# Patient Record
Sex: Female | Born: 1975 | Race: White | Hispanic: No | Marital: Single | Smoking: Former smoker
Health system: Southern US, Community
[De-identification: ages and names within clinical notes are randomized; demographics above are authoritative.]

## PROBLEM LIST (undated history)

## (undated) DIAGNOSIS — M5136 Other intervertebral disc degeneration, lumbar region: Secondary | ICD-10-CM

## (undated) DIAGNOSIS — M779 Enthesopathy, unspecified: Secondary | ICD-10-CM

## (undated) DIAGNOSIS — F419 Anxiety disorder, unspecified: Secondary | ICD-10-CM

## (undated) DIAGNOSIS — M51369 Other intervertebral disc degeneration, lumbar region without mention of lumbar back pain or lower extremity pain: Secondary | ICD-10-CM

## (undated) DIAGNOSIS — M1990 Unspecified osteoarthritis, unspecified site: Secondary | ICD-10-CM

## (undated) DIAGNOSIS — IMO0002 Reserved for concepts with insufficient information to code with codable children: Secondary | ICD-10-CM

## (undated) DIAGNOSIS — F329 Major depressive disorder, single episode, unspecified: Secondary | ICD-10-CM

## (undated) DIAGNOSIS — F32A Depression, unspecified: Secondary | ICD-10-CM

## (undated) DIAGNOSIS — D259 Leiomyoma of uterus, unspecified: Secondary | ICD-10-CM

## (undated) DIAGNOSIS — M419 Scoliosis, unspecified: Secondary | ICD-10-CM

## (undated) DIAGNOSIS — M329 Systemic lupus erythematosus, unspecified: Secondary | ICD-10-CM

## (undated) HISTORY — PX: HERNIA REPAIR: SHX51

## (undated) HISTORY — PX: INSERTION OF MESH: SHX5868

## (undated) HISTORY — PX: LUMBAR FUSION: SHX111

## (undated) HISTORY — PX: ABDOMINAL HYSTERECTOMY: SHX81

---

## 2008-02-08 ENCOUNTER — Emergency Department (HOSPITAL_COMMUNITY): Admission: EM | Admit: 2008-02-08 | Discharge: 2008-02-08 | Payer: Self-pay | Admitting: Emergency Medicine

## 2008-04-27 ENCOUNTER — Emergency Department (HOSPITAL_COMMUNITY): Admission: EM | Admit: 2008-04-27 | Discharge: 2008-04-27 | Payer: Self-pay | Admitting: Emergency Medicine

## 2008-08-29 ENCOUNTER — Emergency Department (HOSPITAL_COMMUNITY): Admission: EM | Admit: 2008-08-29 | Discharge: 2008-08-29 | Payer: Self-pay | Admitting: Emergency Medicine

## 2009-06-29 ENCOUNTER — Emergency Department (HOSPITAL_COMMUNITY): Admission: EM | Admit: 2009-06-29 | Discharge: 2009-06-29 | Payer: Self-pay | Admitting: Emergency Medicine

## 2009-10-15 ENCOUNTER — Emergency Department (HOSPITAL_COMMUNITY): Admission: EM | Admit: 2009-10-15 | Discharge: 2009-10-15 | Payer: Self-pay | Admitting: Emergency Medicine

## 2009-11-29 IMAGING — CR DG CHEST 2V
2 series · 2 of 2 positions shown · non-contrast
Comparison: None

CLINICAL DATA: Swelling

CHEST - 2 VIEW

[view not recorded (1 of 2)]
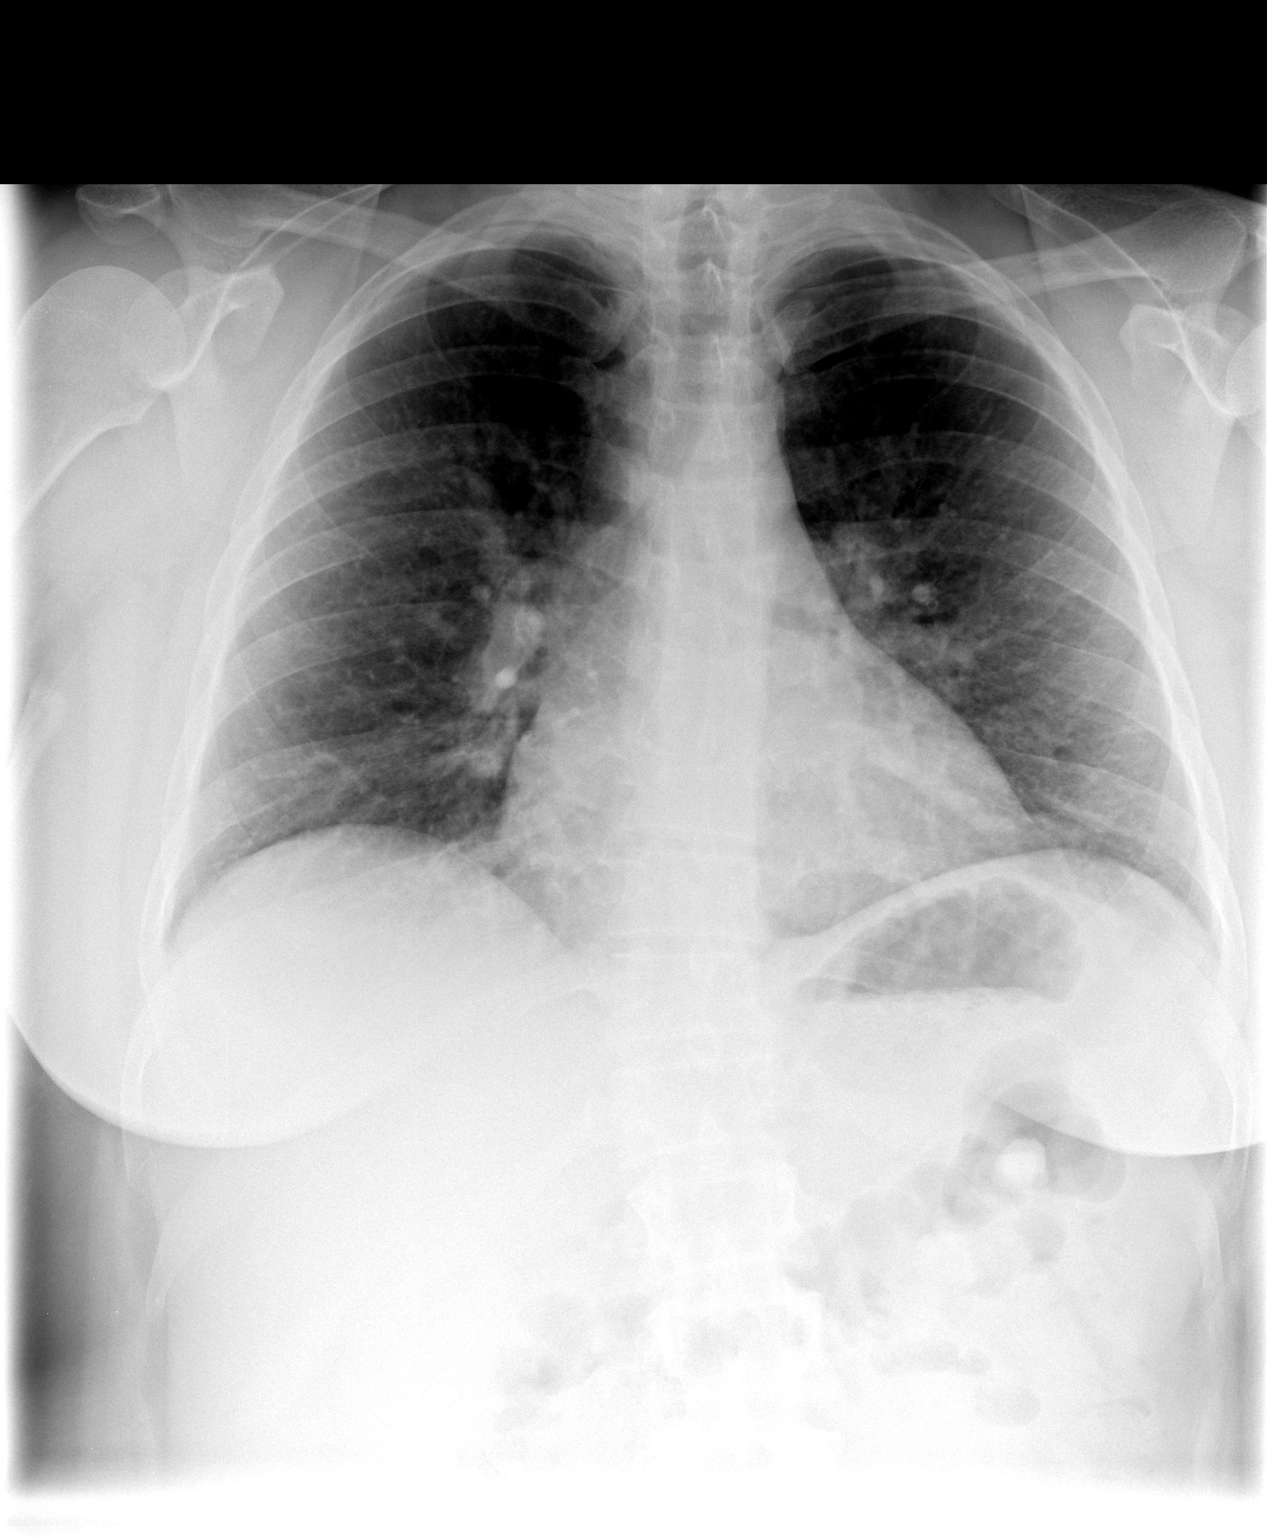

[view not recorded (2 of 2)]
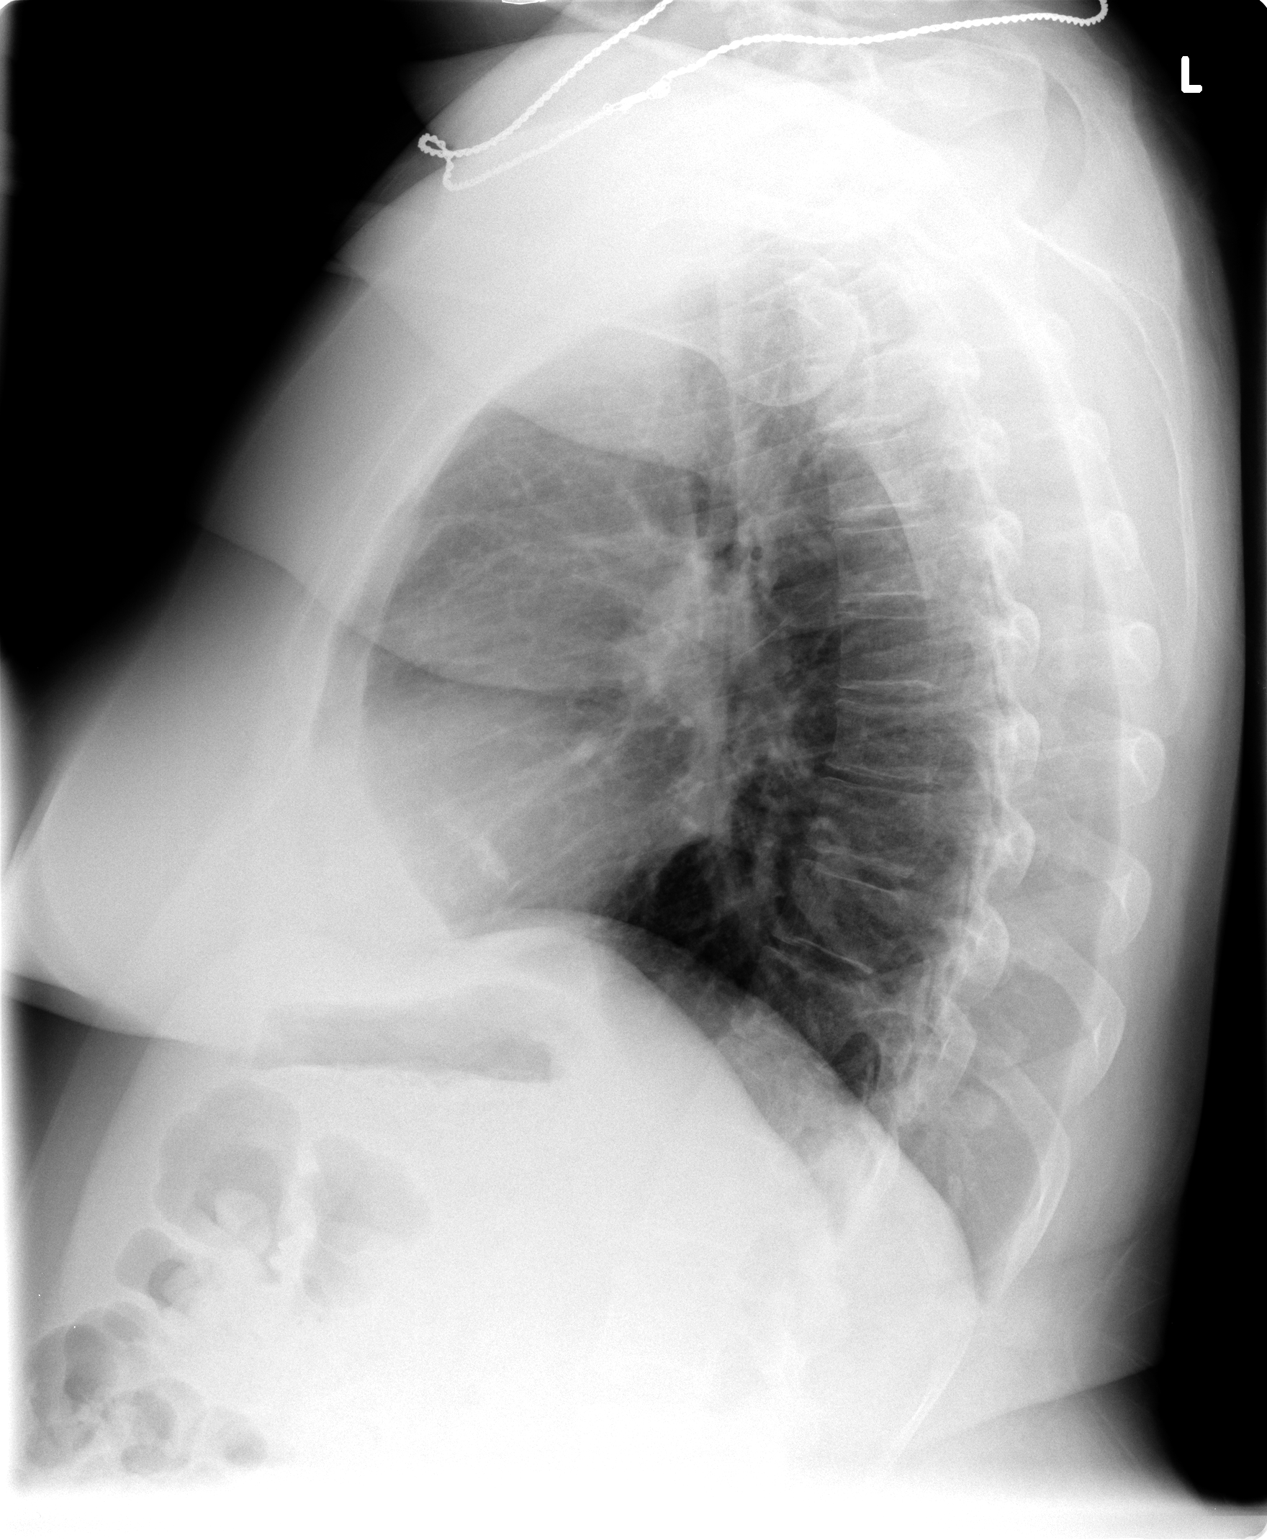

[2 of 2 positions shown; findings below may reference images not displayed]

FINDINGS: There is cardiac enlargement and pulmonary venous
congestion

No pleural effusions or pulmonary interstitial edema

No airspace densities noted.

The visualized osseous structures are unremarkable.
IMPRESSION: 1.  Cardiac enlargement with pulmonary venous congestion,

## 2011-02-23 LAB — SEDIMENTATION RATE: Sed Rate: 12

## 2011-02-23 LAB — D-DIMER, QUANTITATIVE: D-Dimer, Quant: 0.56 — ABNORMAL HIGH

## 2011-02-23 LAB — URINALYSIS, ROUTINE W REFLEX MICROSCOPIC
Bilirubin Urine: NEGATIVE
Protein, ur: NEGATIVE
Specific Gravity, Urine: >1.03 — ABNORMAL HIGH
Urobilinogen, UA: 0.2

## 2011-02-23 LAB — CBC
Hemoglobin: 11.7 — ABNORMAL LOW
RBC: 3.93

## 2011-02-23 LAB — DIFFERENTIAL
Basophils Relative: 0
Eosinophils Absolute: 0.2
Neutrophils Relative %: 58

## 2011-02-23 LAB — COMPREHENSIVE METABOLIC PANEL
ALT: 24
Alkaline Phosphatase: 75
CO2: 26
GFR calc non Af Amer: >60
Glucose, Bld: 101 — ABNORMAL HIGH
Potassium: 3.3 — ABNORMAL LOW
Sodium: 136

## 2011-02-23 LAB — URINE MICROSCOPIC-ADD ON

## 2011-02-23 LAB — TSH: TSH: 1.466

## 2014-04-11 ENCOUNTER — Encounter (HOSPITAL_COMMUNITY): Payer: Self-pay | Admitting: *Deleted

## 2014-04-11 ENCOUNTER — Emergency Department (HOSPITAL_COMMUNITY)
Admission: EM | Admit: 2014-04-11 | Discharge: 2014-04-11 | Disposition: A | Discharge status: Home / Self Care | Payer: Medicaid Other | Attending: Emergency Medicine | Admitting: Emergency Medicine

## 2014-04-11 DIAGNOSIS — Y9389 Activity, other specified: Secondary | ICD-10-CM | POA: Diagnosis not present

## 2014-04-11 DIAGNOSIS — W1849XA Other slipping, tripping and stumbling without falling, initial encounter: Secondary | ICD-10-CM | POA: Insufficient documentation

## 2014-04-11 DIAGNOSIS — R109 Unspecified abdominal pain: Secondary | ICD-10-CM | POA: Insufficient documentation

## 2014-04-11 DIAGNOSIS — Y929 Unspecified place or not applicable: Secondary | ICD-10-CM | POA: Insufficient documentation

## 2014-04-11 DIAGNOSIS — Z8739 Personal history of other diseases of the musculoskeletal system and connective tissue: Secondary | ICD-10-CM | POA: Diagnosis not present

## 2014-04-11 DIAGNOSIS — Y998 Other external cause status: Secondary | ICD-10-CM | POA: Insufficient documentation

## 2014-04-11 DIAGNOSIS — S81812A Laceration without foreign body, left lower leg, initial encounter: Secondary | ICD-10-CM | POA: Insufficient documentation

## 2014-04-11 DIAGNOSIS — Z872 Personal history of diseases of the skin and subcutaneous tissue: Secondary | ICD-10-CM | POA: Diagnosis not present

## 2014-04-11 HISTORY — DX: Reserved for concepts with insufficient information to code with codable children: IMO0002

## 2014-04-11 HISTORY — DX: Systemic lupus erythematosus, unspecified: M32.9

## 2014-04-11 HISTORY — DX: Unspecified osteoarthritis, unspecified site: M19.90

## 2014-04-11 MED ORDER — HYDROCODONE-ACETAMINOPHEN 5-325 MG PO TABS: 1.0000 | ORAL_TABLET | ORAL | Status: DC | PRN

## 2014-04-11 MED ORDER — LIDOCAINE-EPINEPHRINE (PF) 2 %-1:200000 IJ SOLN
10.0000 mL | Freq: Once | INTRAMUSCULAR | Status: AC
  Administered 2014-04-11: 10 mL
  Filled 2014-04-11: qty 20

## 2014-04-11 MED ORDER — POVIDONE-IODINE 10 % EX SOLN
CUTANEOUS | Status: AC
  Administered 2014-04-11: 17:00:00
  Filled 2014-04-11: qty 118

## 2014-04-11 MED ORDER — DOXYCYCLINE HYCLATE 100 MG PO CAPS: 100.0000 mg | ORAL_CAPSULE | Freq: Two times a day (BID) | ORAL | Status: DC

## 2014-04-11 MED ORDER — HYDROCODONE-ACETAMINOPHEN 5-325 MG PO TABS
1.0000 | ORAL_TABLET | Freq: Once | ORAL | Status: AC
  Administered 2014-04-11: 1 via ORAL
  Filled 2014-04-11: qty 1

## 2014-04-11 NOTE — ED Notes (Signed)
Laceration tray and stapler set up at bedside.  Lidocaine at bedside for PA to administer.

## 2014-04-11 NOTE — ED Notes (Signed)
Pt states tripped over a woodpile today at 0430, pt has lac to lower lt calf, bleeding controlled at triage.

## 2014-04-11 NOTE — Discharge Instructions (Signed)
Please cleanse the wound with soap and water. Please apply clean dressing with Neosporin daily. Please have your staples removed in 10 days. Please see your primary physician or return to the emergency department if any red streaking, or Stitches, Staples, or Skin Adhesive Strips  Stitches (sutures), staples, and skin adhesive strips hold the skin together as it heals. They will usually be in place for 7 days or less. HOME CARE  Wash your hands with soap and water before and after you touch your wound.  Only take medicine as told by your doctor.  Cover your wound only if your doctor told you to. Otherwise, leave it open to air.  Do not get your stitches wet or dirty. If they get dirty, dab them gently with a clean washcloth. Wet the washcloth with soapy water. Do not rub. Pat them dry gently.  Do not put medicine or medicated cream on your stitches unless your doctor told you to.  Do not take out your own stitches or staples. Skin adhesive strips will fall off by themselves.  Do not pick at the wound. Picking can cause an infection.  Do not miss your follow-up appointment.  If you have problems or questions, call your doctor. GET HELP RIGHT AWAY IF:   You have a temperature by mouth above 102 F (38.9 C), not controlled by medicine.  You have chills.  You have redness or pain around your stitches.  There is puffiness (swelling) around your stitches.  You notice fluid (drainage) from your stitches.  There is a bad smell coming from your wound. MAKE SURE YOU:  Understand these instructions.  Will watch your condition.  Will get help if you are not doing well or get worse. Document Released: 03/08/2009 Document Revised: 08/03/2011 Document Reviewed: 03/08/2009 [facility] Patient Information 2015 [place], [place]. This information is not intended to replace advice given to you by your health care provider. Make sure you discuss any questions you have with your health care  provider.  signs of infection.

## 2014-04-11 NOTE — ED Provider Notes (Signed)
CSN: [id number]     Arrival date & time 04/11/14  1356 History   First MD Initiated Contact with Patient 04/11/14 1559     Chief Complaint  Patient presents with  . Extremity Laceration     (Consider location/radiation/quality/duration/timing/severity/associated sxs/prior Treatment) Patient is a 38 y.o. female presenting with skin laceration. The history is provided by the patient.  Laceration Location:  Leg Leg laceration location:  L lower leg Length (cm):  3.1 Depth:  Cutaneous Quality: jagged   Bleeding: controlled   Time since incident:  11 hours Injury mechanism: piece of wood. Pain details:    Quality:  Aching and throbbing   Severity:  Moderate   Timing:  Constant   Progression:  Worsening Foreign body present:  No foreign bodies Relieved by:  Nothing Ineffective treatments:  None tried Tetanus status:  Out of date   Past Medical History  Diagnosis Date  . Arthritis   . Lupus   . Ulcer of abdomen wall    History reviewed. No pertinent past surgical history. History reviewed. No pertinent family history. History  Substance Use Topics  . Smoking status: Never Smoker   . Smokeless tobacco: Not on file  . Alcohol Use: No   OB History    No data available     Review of Systems  Constitutional: Negative for activity change.       All ROS Neg except as noted in HPI  Eyes: Negative for photophobia and discharge.  Respiratory: Negative for cough, shortness of breath and wheezing.   Cardiovascular: Negative for chest pain and palpitations.  Gastrointestinal: Positive for abdominal pain. Negative for blood in stool.  Genitourinary: Negative for dysuria, frequency and hematuria.  Musculoskeletal: Positive for arthralgias. Negative for back pain and neck pain.  Skin: Negative.   Neurological: Negative for dizziness, seizures and speech difficulty.  Psychiatric/Behavioral: Negative for hallucinations and confusion.      Allergies  Nsaids  Home  Medications   Prior to Admission medications   Not on File   BP 113/83 mmHg  Pulse 80  Temp(Src) 98.7 F (37.1 C) (Oral)  Resp 18  Ht 5\' 3"  (1.6 m)  Wt 153 lb (69.4 kg)  BMI 27.11 kg/m2  SpO2 100%  LMP 04/06/2014 Physical Exam  Constitutional: She is oriented to person, place, and time. She appears well-developed and well-nourished.  Non-toxic appearance.  HENT:  Head: Normocephalic.  Right Ear: Tympanic membrane and external ear normal.  Left Ear: Tympanic membrane and external ear normal.  Eyes: EOM and lids are normal. Pupils are equal, round, and reactive to light.  Neck: Normal range of motion. Neck supple. Carotid bruit is not present.  Cardiovascular: Normal rate, regular rhythm, normal heart sounds, intact distal pulses and normal pulses.   Pulmonary/Chest: Breath sounds normal. No respiratory distress.  Abdominal: Soft. Bowel sounds are normal. There is no tenderness. There is no guarding.  Musculoskeletal: Normal range of motion.       Left lower leg: She exhibits tenderness.       Legs: Lymphadenopathy:       Head (right side): No submandibular adenopathy present.       Head (left side): No submandibular adenopathy present.    She has no cervical adenopathy.  Neurological: She is alert and oriented to person, place, and time. She has normal strength. No cranial nerve deficit or sensory deficit.  Skin: Skin is warm and dry.  Psychiatric: She has a normal mood and affect. Her speech is normal.  Nursing note and vitals reviewed.   ED Course  LACERATION REPAIR Date/Time: 04/11/2014 4:39 PM Performed by: Lenox Ahr Authorized by: Lenox Ahr Consent: Verbal consent obtained. Risks and benefits: risks, benefits and alternatives were discussed Consent given by: patient Patient understanding: patient states understanding of the procedure being performed Required items: required blood products, implants, devices, and special equipment available Patient  identity confirmed: arm band Time out: Immediately prior to procedure a "time out" was called to verify the correct patient, procedure, equipment, support staff and site/side marked as required. Body area: lower extremity Location details: left lower leg Laceration length: 3.1 cm Foreign bodies: no foreign bodies Tendon involvement: none Nerve involvement: none Vascular damage: no Anesthesia: local infiltration Local anesthetic: lidocaine 2% with epinephrine Patient sedated: no Preparation: Patient was prepped and draped in the usual sterile fashion. Irrigation solution: saline Amount of cleaning: standard Skin closure: staples Number of sutures: 5 Approximation difficulty: simple Dressing: gauze roll Patient tolerance: Patient tolerated the procedure well with no immediate complications   (including critical care time) Labs Review Labs Reviewed - No data to display  Imaging Review No results found.   EKG Interpretation None      MDM  Patient sustained the wound at approximately 4:30 in the morning. Discussed with the patient need to be on antibiotics because of the timeframe of delayed repair. The wound was repaired loosely. Patient is advised to change the dressing daily, she is placed on doxycycline and Norco. The patient is to see the primary physician or return to the emergency department if any signs of infection.    Final diagnoses:  Laceration of leg, left, initial encounter    *I have reviewed nursing notes, vital signs, and all appropriate lab and imaging results for this patient.[address] Pryor Montes, PA-C 04/11/14 2058  Orpah Greek, MD 04/11/14 [phone]

## 2014-05-26 ENCOUNTER — Emergency Department: Payer: Self-pay | Admitting: Internal Medicine

## 2014-06-24 ENCOUNTER — Emergency Department (HOSPITAL_COMMUNITY)
Admission: EM | Admit: 2014-06-24 | Discharge: 2014-06-24 | Disposition: A | Discharge status: Home / Self Care | Payer: Medicaid Other | Attending: Emergency Medicine | Admitting: Emergency Medicine

## 2014-06-24 ENCOUNTER — Encounter (HOSPITAL_COMMUNITY): Payer: Self-pay | Admitting: Emergency Medicine

## 2014-06-24 ENCOUNTER — Emergency Department (HOSPITAL_COMMUNITY): Payer: Medicaid Other

## 2014-06-24 DIAGNOSIS — Z872 Personal history of diseases of the skin and subcutaneous tissue: Secondary | ICD-10-CM | POA: Insufficient documentation

## 2014-06-24 DIAGNOSIS — M1990 Unspecified osteoarthritis, unspecified site: Secondary | ICD-10-CM | POA: Insufficient documentation

## 2014-06-24 DIAGNOSIS — Y9289 Other specified places as the place of occurrence of the external cause: Secondary | ICD-10-CM | POA: Insufficient documentation

## 2014-06-24 DIAGNOSIS — Y9389 Activity, other specified: Secondary | ICD-10-CM | POA: Diagnosis not present

## 2014-06-24 DIAGNOSIS — Y998 Other external cause status: Secondary | ICD-10-CM | POA: Insufficient documentation

## 2014-06-24 DIAGNOSIS — S300XXA Contusion of lower back and pelvis, initial encounter: Secondary | ICD-10-CM | POA: Diagnosis not present

## 2014-06-24 DIAGNOSIS — Z720 Tobacco use: Secondary | ICD-10-CM | POA: Insufficient documentation

## 2014-06-24 DIAGNOSIS — S161XXA Strain of muscle, fascia and tendon at neck level, initial encounter: Secondary | ICD-10-CM

## 2014-06-24 DIAGNOSIS — W01198A Fall on same level from slipping, tripping and stumbling with subsequent striking against other object, initial encounter: Secondary | ICD-10-CM | POA: Insufficient documentation

## 2014-06-24 DIAGNOSIS — Z8739 Personal history of other diseases of the musculoskeletal system and connective tissue: Secondary | ICD-10-CM | POA: Insufficient documentation

## 2014-06-24 DIAGNOSIS — S199XXA Unspecified injury of neck, initial encounter: Secondary | ICD-10-CM | POA: Diagnosis present

## 2014-06-24 MED ORDER — HYDROCODONE-ACETAMINOPHEN 5-325 MG PO TABS
1.0000 | ORAL_TABLET | Freq: Once | ORAL | Status: AC
  Administered 2014-06-24: 1 via ORAL
  Filled 2014-06-24: qty 1

## 2014-06-24 MED ORDER — HYDROCODONE-ACETAMINOPHEN 5-325 MG PO TABS: 1.0000 | ORAL_TABLET | ORAL | Status: DC | PRN

## 2014-06-24 MED ORDER — CYCLOBENZAPRINE HCL 10 MG PO TABS: 10.0000 mg | ORAL_TABLET | Freq: Two times a day (BID) | ORAL | Status: DC | PRN

## 2014-06-24 MED ORDER — CYCLOBENZAPRINE HCL 10 MG PO TABS
10.0000 mg | ORAL_TABLET | Freq: Once | ORAL | Status: AC
  Administered 2014-06-24: 10 mg via ORAL
  Filled 2014-06-24: qty 1

## 2014-06-24 NOTE — ED Provider Notes (Signed)
CSN: [id number]     Arrival date & time 06/24/14  1408 History  This chart was scribed for non-physician practitioner, Debroah Baller, FNP,working with Nat Christen, MD, by Marlowe Kays, ED Scribe. This patient was seen in room APFT22/APFT22 and the patient's care was started at 4:10 PM.  Chief Complaint  Patient presents with  . Back Pain  . Suture / Staple Removal   Patient is a 39 y.o. female presenting with back pain and suture removal. The history is provided by the patient, medical records and a parent. No language interpreter was used.  Back Pain Associated symptoms: no fever   Suture / Staple Removal    HPI Comments:  Megan Gregory is a 40 y.o. female who presents to the Emergency Department complaining of severe lower back pain that started secondary to slipping and falling on concrete flat on her back approximately six hours ago. She reports some associated neck pain that radiates down her right shoulder and arm. She reports some numbness and tingling in her right finger tips. She has taken Tylenol and Motrin (last dose PTA) with no significant relief of the pain. Movement or walking makes the pain worse. Denies alleviating factors. Denies LOC, visual changes, fever, chills, nausea, vomiting, bowel or bladder incontinence. PMH of arthritis, lupus, RA and abdominal ulcer. She has been scheduled to see a "spine" doctor due to her chronic low back pain and scoliosis. She has been told she has RA and other spinal problems that may require surgery. She is also in the process of getting scheduled to be seen in a pain clinic.  In addition she is here today for staple removal to the left lower leg that resulted from a previous fall.   Past Medical History  Diagnosis Date  . Arthritis   . Lupus   . Ulcer of abdomen wall    History reviewed. No pertinent past surgical history. Family History  Problem Relation Age of Onset  . Cancer Other   . Hypertension Other   . Stroke Other   .  Diabetes Other    History  Substance Use Topics  . Smoking status: Current Every Day Smoker -- 0.50 packs/day for 10 years    Types: Cigarettes  . Smokeless tobacco: Never Used  . Alcohol Use: No   OB History    Gravida Para Term Preterm AB TAB SAB Ectopic Multiple Living   3 2 2  1  1   2      Review of Systems  Constitutional: Negative for fever and chills.  Gastrointestinal: Negative for nausea and vomiting.  Genitourinary:       No bowel or bladder incontinence.  Musculoskeletal: Positive for back pain.  Skin: Positive for wound.  Neurological: Negative for syncope.  All other systems reviewed and are negative.   Allergies  Nsaids  Home Medications   Prior to Admission medications   Medication Sig Start Date End Date Taking? Authorizing Provider  cyclobenzaprine (FLEXERIL) 10 MG tablet Take 1 tablet (10 mg total) by mouth 2 (two) times daily as needed for muscle spasms. 06/24/14   Hope Bunnie Pion, NP  HYDROcodone-acetaminophen (NORCO/VICODIN) 5-325 MG per tablet Take 1 tablet by mouth every 4 (four) hours as needed. 06/24/14   Hope Bunnie Pion, NP   Triage Vitals: BP 161/98 mmHg  Pulse 106  Temp(Src) 98.1 F (36.7 C) (Oral)  Resp 18  Ht 5\' 3"  (1.6 m)  Wt 155 lb (70.308 kg)  BMI 27.46 kg/m2  SpO2 99%  LMP 06/22/2014 Physical Exam  Constitutional: She is oriented to person, place, and time. She appears well-developed and well-nourished. No distress.  HENT:  Head: Normocephalic and atraumatic.  Right Ear: Tympanic membrane normal.  Left Ear: Tympanic membrane normal.  Nose: Nose normal.  Mouth/Throat: Uvula is midline, oropharynx is clear and moist and mucous membranes are normal.  Eyes: Conjunctivae and EOM are normal. Pupils are equal, round, and reactive to light.  Neck: Normal range of motion. Neck supple.  Cardiovascular: Normal rate, regular rhythm and normal heart sounds.  Exam reveals no gallop and no friction rub.   No murmur heard. Pulses:      Dorsalis  pedis pulses are 2+ on the right side, and 2+ on the left side.       Posterior tibial pulses are 2+ on the right side, and 2+ on the left side.  Pulmonary/Chest: Effort normal and breath sounds normal. No respiratory distress. She has no wheezes. She has no rales.  Abdominal: Soft. There is no tenderness.  Musculoskeletal: Normal range of motion. She exhibits tenderness.       Lumbar back: She exhibits tenderness, pain and spasm. She exhibits normal pulse.  Tender to palpation of right paraspinal muscles and over sciatic nerve. Pain with SLR bilaterally, worse on the right. Pedal pulses 2+ and equal, adequate circulation, good touch sensation.    Neurological: She is alert and oriented to person, place, and time. She has normal strength. She displays normal reflexes. No cranial nerve deficit or sensory deficit. Gait normal.  Reflex Scores:      Bicep reflexes are 2+ on the right side and 2+ on the left side.      Brachioradialis reflexes are 2+ on the right side and 2+ on the left side.      Patellar reflexes are 2+ on the right side and 2+ on the left side.      Achilles reflexes are 2+ on the right side and 2+ on the left side. Ambulatory with steady gait. No foot drag. Distal sensations intact.  Skin: Skin is warm and dry.  Well-healing wound to LLE. No signs of infection.  Psychiatric: She has a normal mood and affect. Her behavior is normal.  Nursing note and vitals reviewed.   ED Course  Procedures (including critical care time) X-rays, pain management. DIAGNOSTIC STUDIES: Oxygen Saturation is 99% on RA, normal by my interpretation.   COORDINATION OF CARE: 4:18 PM- Pt states pain is not alleviated by Vicodin. Will order another pain medication and wait for X-Rays to result. Pt verbalizes understanding and agrees to plan.  Medications  HYDROcodone-acetaminophen (NORCO/VICODIN) 5-325 MG per tablet 1 tablet (1 tablet Oral Given 06/24/14 1457)  cyclobenzaprine (FLEXERIL) tablet 10 mg  (10 mg Oral Given 06/24/14 1621)   Labs Review Labs Reviewed - No data to display  Imaging Review Dg Cervical Spine Complete  06/24/2014   CLINICAL DATA:  [place] this morning on her deck on snow, hit her head and low back, dizziness, back, neck and right shoulder pain, tingling down right arm  EXAM: CERVICAL SPINE  4+ VIEWS  COMPARISON:  04/27/2008.  FINDINGS: No fracture.  No spondylolisthesis.  There are no significant degenerative changes. The neural foramina appear widely patent.  Soft tissues are unremarkable.  IMPRESSION: Negative cervical spine radiographs.   Electronically Signed   By: Lajean Manes M.D.   On: 06/24/2014 16:17   Dg Lumbar Spine Complete  06/24/2014   CLINICAL DATA:  Fall. Head trauma. Lumbago.  Posttraumatic back pain. Dizziness. Tingling down the RIGHT arm.  EXAM: LUMBAR SPINE - COMPLETE 4+ VIEW  COMPARISON:  None.  FINDINGS: Mild levoconvex curve of the lumbar spine with the apex at L3. This may be positional although it appears unchanged compared to the prior exam from 05/26/2014. There are 5 lumbar type vertebral bodies. Vertebral body height is preserved. Lower lumbar facet arthrosis. Intervertebral disc spaces are normal. On the lateral view, the alignment is anatomic. The lumbosacral junction is normal.  IMPRESSION: No acute abnormality or interval change from recent prior.   Electronically Signed   By: Dereck Ligas M.D.   On: 06/24/2014 16:18    MDM  39 y.o. female with low back pain and neck pain that radiates to the right arm s/p fall earlier today. I have reviewed this patient's vital signs, nurses notes, appropriate labs and imaging.  I have discussed findings and plan of care with the patient and she voices understanding and agrees with plan. Stable for d/c without neuro deficits. Patient to follow up with her PCP tomorrow.  Final diagnoses:  Cervical strain, acute, initial encounter  Lumbar contusion, initial encounter   I personally performed the services  described in this documentation, which was scribed in my presence. The recorded information has been reviewed and is accurate.    [place], NP 06/24/14 [place], MD 06/26/14 [phone]

## 2014-06-24 NOTE — Discharge Instructions (Signed)
Cervical Sprain A cervical sprain is when the tissues (ligaments) that hold the neck bones in place stretch or tear. HOME CARE   Put ice on the injured area.  Put ice in a plastic bag.  Place a towel between your skin and the bag.  Leave the ice on for 15-20 minutes, 3-4 times a day.  You may have been given a collar to wear. This collar keeps your neck from moving while you heal.  Do not take the collar off unless told by your doctor.  If you have long hair, keep it outside of the collar.  Ask your doctor before changing the position of your collar. You may need to change its position over time to make it more comfortable.  If you are allowed to take off the collar for cleaning or bathing, follow your doctor's instructions on how to do it safely.  Keep your collar clean by wiping it with mild soap and water. Dry it completely. If the collar has removable pads, remove them every 1-2 days to hand wash them with soap and water. Allow them to air dry. They should be dry before you wear them in the collar.  Do not drive while wearing the collar.  Only take medicine as told by your doctor.  Keep all doctor visits as told.  Keep all physical therapy visits as told.  Adjust your work station so that you have good posture while you work.  Avoid positions and activities that make your problems worse.  Warm up and stretch before being active. GET HELP IF:  Your pain is not controlled with medicine.  You cannot take less pain medicine over time as planned.  Your activity level does not improve as expected. GET HELP RIGHT AWAY IF:   You are bleeding.  Your stomach is upset.  You have an allergic reaction to your medicine.  You develop new problems that you cannot explain.  You lose feeling (become numb) or you cannot move any part of your body (paralysis).  You have tingling or weakness in any part of your body.  Your symptoms get worse. Symptoms include:  Pain,  soreness, stiffness, puffiness (swelling), or a burning feeling in your neck.  Pain when your neck is touched.  Shoulder or upper back pain.  Limited ability to move your neck.  Headache.  Dizziness.  Your hands or arms feel week, lose feeling, or tingle.  Muscle spasms.  Difficulty swallowing or chewing. MAKE SURE YOU:   Understand these instructions.  Will watch your condition.  Will get help right away if you are not doing well or get worse. Document Released: 10/28/2007 Document Revised: 01/11/2013 Document Reviewed: 11/16/2012 [facility] Patient Information 2015 [place], [place]. This information is not intended to replace advice given to you by your health care provider. Make sure you discuss any questions you have with your health care provider.  Contusion A contusion is a deep bruise. Contusions happen when an injury causes bleeding under the skin. Signs of bruising include pain, puffiness (swelling), and discolored skin. The contusion may turn blue, purple, or yellow. HOME CARE   Put ice on the injured area.  Put ice in a plastic bag.  Place a towel between your skin and the bag.  Leave the ice on for 15-20 minutes, 03-04 times a day.  Only take medicine as told by your doctor.  Rest the injured area.  If possible, raise (elevate) the injured area to lessen puffiness. GET HELP RIGHT AWAY IF:  You have more bruising or puffiness.  You have pain that is getting worse.  Your puffiness or pain is not helped by medicine. MAKE SURE YOU:   Understand these instructions.  Will watch your condition.  Will get help right away if you are not doing well or get worse. Document Released: 10/28/2007 Document Revised: 08/03/2011 Document Reviewed: 03/16/2011 [facility] Patient Information 2015 [place], [place]. This information is not intended to replace advice given to you by your health care provider. Make sure you discuss any questions you have with your health care  provider.

## 2014-06-24 NOTE — ED Notes (Addendum)
Patient reports slipping on ice and falling today, landing on back. Per patient has had a previous pain from another recent fall and has been going to PCP and RA doctor. Patient states she is supposed to she "spin specialist" in 2.5 weeks for possible surgery. Patient called PCP's hotline today and was told to come to ed. Patient also reports right shoulder pain with numbness. Reports hitting head "slightly." Denies any LOC, blurred vision, or dizziness. CNS intact. Right radial pulse strong. Per patient also needs staples removed from left leg from the previous fall. No signs or symptoms of infection noted.

## 2015-10-05 ENCOUNTER — Emergency Department (HOSPITAL_COMMUNITY): Payer: Self-pay

## 2015-10-05 ENCOUNTER — Encounter (HOSPITAL_COMMUNITY): Payer: Self-pay | Admitting: *Deleted

## 2015-10-05 ENCOUNTER — Emergency Department (HOSPITAL_COMMUNITY)
Admission: EM | Admit: 2015-10-05 | Discharge: 2015-10-05 | Disposition: A | Discharge status: Home / Self Care | Payer: Self-pay | Attending: Emergency Medicine | Admitting: Emergency Medicine

## 2015-10-05 DIAGNOSIS — Z79899 Other long term (current) drug therapy: Secondary | ICD-10-CM | POA: Insufficient documentation

## 2015-10-05 DIAGNOSIS — F17210 Nicotine dependence, cigarettes, uncomplicated: Secondary | ICD-10-CM | POA: Insufficient documentation

## 2015-10-05 DIAGNOSIS — Y999 Unspecified external cause status: Secondary | ICD-10-CM | POA: Insufficient documentation

## 2015-10-05 DIAGNOSIS — S46912A Strain of unspecified muscle, fascia and tendon at shoulder and upper arm level, left arm, initial encounter: Secondary | ICD-10-CM | POA: Insufficient documentation

## 2015-10-05 DIAGNOSIS — S0093XA Contusion of unspecified part of head, initial encounter: Secondary | ICD-10-CM | POA: Insufficient documentation

## 2015-10-05 DIAGNOSIS — S161XXA Strain of muscle, fascia and tendon at neck level, initial encounter: Secondary | ICD-10-CM | POA: Insufficient documentation

## 2015-10-05 DIAGNOSIS — S20212A Contusion of left front wall of thorax, initial encounter: Secondary | ICD-10-CM | POA: Insufficient documentation

## 2015-10-05 DIAGNOSIS — Y929 Unspecified place or not applicable: Secondary | ICD-10-CM | POA: Insufficient documentation

## 2015-10-05 DIAGNOSIS — Y9389 Activity, other specified: Secondary | ICD-10-CM | POA: Insufficient documentation

## 2015-10-05 DIAGNOSIS — W11XXXA Fall on and from ladder, initial encounter: Secondary | ICD-10-CM | POA: Insufficient documentation

## 2015-10-05 DIAGNOSIS — W19XXXA Unspecified fall, initial encounter: Secondary | ICD-10-CM

## 2015-10-05 DIAGNOSIS — R51 Headache: Secondary | ICD-10-CM | POA: Insufficient documentation

## 2015-10-05 LAB — URINALYSIS, ROUTINE W REFLEX MICROSCOPIC
Bilirubin Urine: NEGATIVE
GLUCOSE, UA: NEGATIVE mg/dL
KETONES UR: NEGATIVE mg/dL
LEUKOCYTES UA: NEGATIVE
NITRITE: NEGATIVE
PROTEIN: NEGATIVE mg/dL
Specific Gravity, Urine: <1.005 — ABNORMAL LOW (ref 1.005–1.030)
pH: 5.5 (ref 5.0–8.0)

## 2015-10-05 LAB — URINE MICROSCOPIC-ADD ON

## 2015-10-05 LAB — PREGNANCY, URINE: PREG TEST UR: NEGATIVE

## 2015-10-05 MED ORDER — HYDROCODONE-ACETAMINOPHEN 5-325 MG PO TABS: 1.0000 | ORAL_TABLET | ORAL | Status: DC | PRN

## 2015-10-05 MED ORDER — MORPHINE SULFATE (PF) 4 MG/ML IV SOLN
4.0000 mg | Freq: Once | INTRAVENOUS | Status: AC
  Administered 2015-10-05: 4 mg via INTRAMUSCULAR
  Filled 2015-10-05: qty 1

## 2015-10-05 MED ORDER — ONDANSETRON 4 MG PO TBDP
4.0000 mg | ORAL_TABLET | Freq: Once | ORAL | Status: AC
  Administered 2015-10-05: 4 mg via ORAL
  Filled 2015-10-05: qty 1

## 2015-10-05 NOTE — ED Notes (Signed)
Pt states she was cleaning her satellite this morning and was coming down the ladder off of her roof when she slipped and fell. Pt states she landed on he left side and is having pain in her rib area and shoulder. Pt states she hit her head on the ground but did not have any loss of consciousness. Pt is alert and oriented in triage. Pt also states she has a headache but she had this before the fall. Denies any dizziness.

## 2015-10-05 NOTE — Discharge Instructions (Signed)
Blunt Chest Trauma °Blunt chest trauma is an injury caused by a blow to the chest. These chest injuries can be very painful. Blunt chest trauma often results in bruised or broken (fractured) ribs. Most cases of bruised and fractured ribs from blunt chest traumas get better after 1 to 3 weeks of rest and pain medicine. Often, the soft tissue in the chest wall is also injured, causing pain and bruising. Internal organs, such as the heart and lungs, may also be injured. Blunt chest trauma can lead to serious medical problems. This injury requires immediate medical care. °CAUSES  °· Motor vehicle collisions. °· Falls. °· Physical violence. °· Sports injuries. °SYMPTOMS  °· Chest pain. The pain may be worse when you move or breathe deeply. °· Shortness of breath. °· Lightheadedness. °· Bruising. °· Tenderness. °· Swelling. °DIAGNOSIS  °Your caregiver will do a physical exam. X-rays may be taken to look for fractures. However, minor rib fractures may not show up on X-rays until a few days after the injury. If a more serious injury is suspected, further imaging tests may be done. This may include ultrasounds, computed tomography (CT) scans, or magnetic resonance imaging (MRI). °TREATMENT  °Treatment depends on the severity of your injury. Your caregiver may prescribe pain medicines and deep breathing exercises. °HOME CARE INSTRUCTIONS °· Limit your activities until you can move around without much pain. °· Do not do any strenuous work until your injury is healed. °· Put ice on the injured area. °¨ Put ice in a plastic bag. °¨ Place a towel between your skin and the bag. °¨ Leave the ice on for 15-20 minutes, 03-04 times a day. °· You may wear a rib belt as directed by your caregiver to reduce pain. °· Practice deep breathing as directed by your caregiver to keep your lungs clear. °· Only take over-the-counter or prescription medicines for pain, fever, or discomfort as directed by your caregiver. °SEEK IMMEDIATE MEDICAL  CARE IF:  °· You have increasing pain or shortness of breath. °· You cough up blood. °· You have nausea, vomiting, or abdominal pain. °· You have a fever. °· You feel dizzy, weak, or you faint. °MAKE SURE YOU: °· Understand these instructions. °· Will watch your condition. °· Will get help right away if you are not doing well or get worse. °  °This information is not intended to replace advice given to you by your health care provider. Make sure you discuss any questions you have with your health care provider. °  °Document Released: 06/18/2004 Document Revised: 06/01/2014 Document Reviewed: 11/07/2014 °[employer] Interactive Patient Education ©2016 [employer] ° °

## 2015-10-05 NOTE — ED Provider Notes (Signed)
CSN: [id number]     Arrival date & time 10/05/15  1358 History   First MD Initiated Contact with Patient 10/05/15 1448     Chief Complaint  Patient presents with  . Fall   PT FELL OFF THE TOP OF HER MOBILE HOME ROOF.  THE PT SAID THAT SHE WAS CLEANING HER SATELLITE AND SLIPPED COMING DOWN OFF THE LADDER.  PT C/O LEFT SIDED RIB PAIN AND LEFT NECK/SHOULDER PAIN.  PT DID HIT HER HEAD, BUT DID NOT HAVE A LOC.  (Consider location/radiation/quality/duration/timing/severity/associated sxs/prior Treatment) Patient is a 40 y.o. female presenting with fall.  Fall This is a new problem. The current episode started 3 to 5 hours ago. The problem occurs constantly. The problem has not changed since onset.Associated symptoms include headaches. Nothing aggravates the symptoms. Nothing relieves the symptoms.    Past Medical History  Diagnosis Date  . Arthritis   . Lupus ([place])   . Ulcer of abdomen wall ([place])    History reviewed. No pertinent past surgical history. Family History  Problem Relation Age of Onset  . Cancer Other   . Hypertension Other   . Stroke Other   . Diabetes Other    Social History  Substance Use Topics  . Smoking status: Current Every Day Smoker -- 0.50 packs/day for 10 years    Types: Cigarettes  . Smokeless tobacco: Never Used  . Alcohol Use: No   OB History    Gravida Para Term Preterm AB TAB SAB Ectopic Multiple Living   3 2 2  1  1   2      Review of Systems  Musculoskeletal:       LEFT CHEST AND SHOULDER PAIN  Neurological: Positive for headaches.  All other systems reviewed and are negative.     Allergies  Nsaids  Home Medications   Prior to Admission medications   Medication Sig Start Date End Date Taking? Authorizing Provider  acetaminophen (TYLENOL) 500 MG tablet Take 1,000 mg by mouth every 4 (four) hours as needed for mild pain or moderate pain.   Yes Historical Provider, MD  HYDROcodone-acetaminophen (NORCO/VICODIN) 5-325 MG tablet Take 1 tablet  by mouth every 4 (four) hours as needed. 10/05/15   Isla Pence, MD   BP 165/86 mmHg  Pulse 106  Temp(Src) 97.9 F (36.6 C) (Oral)  Resp 16  Ht 5\' 3"  (1.6 m)  Wt 186 lb (84.369 kg)  BMI 32.96 kg/m2  SpO2 100%  LMP 10/02/2015 Physical Exam  Constitutional: She is oriented to person, place, and time. She appears well-developed and well-nourished.  HENT:  Head: Normocephalic and atraumatic.  Right Ear: External ear normal.  Left Ear: External ear normal.  Mouth/Throat: Oropharynx is clear and moist.  Eyes: Conjunctivae are normal. Pupils are equal, round, and reactive to light.  Neck: Normal range of motion. Neck supple.  Cardiovascular: Normal rate, regular rhythm, normal heart sounds and intact distal pulses.   Pulmonary/Chest: Effort normal and breath sounds normal.  Abdominal: Soft. Bowel sounds are normal.  Musculoskeletal: Normal range of motion.       Left shoulder: She exhibits tenderness.       Left elbow: Tenderness found.  Neurological: She is alert and oriented to person, place, and time.  Skin: Skin is warm and dry.  Psychiatric: She has a normal mood and affect. Her behavior is normal. Judgment and thought content normal.  Nursing note and vitals reviewed.   ED Course  Procedures (including critical care time) Labs Review Labs  Reviewed  URINALYSIS, ROUTINE W REFLEX MICROSCOPIC (NOT AT [facility]) - Abnormal; Notable for the following:    APPearance HAZY (*)    Specific Gravity, Urine <1.005 (*)    Hgb urine dipstick LARGE (*)    All other components within normal limits  URINE MICROSCOPIC-ADD ON - Abnormal; Notable for the following:    Squamous Epithelial / LPF 6-30 (*)    Bacteria, UA RARE (*)    All other components within normal limits  PREGNANCY, URINE    Imaging Review Dg Ribs Unilateral W/chest Left  10/05/2015  CLINICAL DATA:  Fall today, left shoulder pain, left rib pain EXAM: LEFT RIBS AND CHEST - 3+ VIEW COMPARISON:  02/08/2008 FINDINGS: Four views  left ribs submitted. Cardiomediastinal silhouette is stable. No acute infiltrate or pulmonary edema. No left rib fracture is identified. There is no pneumothorax. IMPRESSION: Negative. Electronically Signed   By: Lahoma Crocker M.D.   On: 10/05/2015 16:21   Ct Head Wo Contrast  10/05/2015  CLINICAL DATA:  Pain after fall. EXAM: CT HEAD WITHOUT CONTRAST CT CERVICAL SPINE WITHOUT CONTRAST TECHNIQUE: Multidetector CT imaging of the head and cervical spine was performed following the standard protocol without intravenous contrast. Multiplanar CT image reconstructions of the cervical spine were also generated. COMPARISON:  None. FINDINGS: CT HEAD FINDINGS Paranasal sinuses, mastoid air cells, and visualized bones are within normal limits. The extracranial soft tissues are within normal limits. No swelling, laceration, or hematoma seen within the scalp. No subdural, epidural, or subarachnoid hemorrhage. Cerebellum, brainstem, and basal cisterns are normal. Ventricles and sulci are normal for age. No acute cortical ischemia or infarct. No mass, mass effect, or midline shift. CT CERVICAL SPINE FINDINGS There is straightening of normal lordosis with no traumatic malalignment. No fractures are seen. No significant degenerative changes. Limited views of the upper chest are unremarkable. No other bony or soft tissue abnormalities are seen. IMPRESSION: 1. No acute intracranial abnormality. 2. No cervical spine fracture or malalignment. Electronically Signed   By: Dorise Bullion III M.D   On: 10/05/2015 16:41   Ct Cervical Spine Wo Contrast  10/05/2015  CLINICAL DATA:  Pain after fall. EXAM: CT HEAD WITHOUT CONTRAST CT CERVICAL SPINE WITHOUT CONTRAST TECHNIQUE: Multidetector CT imaging of the head and cervical spine was performed following the standard protocol without intravenous contrast. Multiplanar CT image reconstructions of the cervical spine were also generated. COMPARISON:  None. FINDINGS: CT HEAD FINDINGS Paranasal  sinuses, mastoid air cells, and visualized bones are within normal limits. The extracranial soft tissues are within normal limits. No swelling, laceration, or hematoma seen within the scalp. No subdural, epidural, or subarachnoid hemorrhage. Cerebellum, brainstem, and basal cisterns are normal. Ventricles and sulci are normal for age. No acute cortical ischemia or infarct. No mass, mass effect, or midline shift. CT CERVICAL SPINE FINDINGS There is straightening of normal lordosis with no traumatic malalignment. No fractures are seen. No significant degenerative changes. Limited views of the upper chest are unremarkable. No other bony or soft tissue abnormalities are seen. IMPRESSION: 1. No acute intracranial abnormality. 2. No cervical spine fracture or malalignment. Electronically Signed   By: Dorise Bullion III M.D   On: 10/05/2015 16:41   Dg Shoulder Left  10/05/2015  CLINICAL DATA:  Fall today outside, left shoulder pain, left rib pain EXAM: LEFT SHOULDER - 2+ VIEW COMPARISON:  None. FINDINGS: Three views of the left shoulder submitted. No acute fracture or subluxation. AC joint and glenohumeral joint are preserved. IMPRESSION: Negative. Electronically Signed  By: Lahoma Crocker M.D.   On: 10/05/2015 16:21   I have personally reviewed and evaluated these images and lab results as part of my medical decision-making.   EKG Interpretation None      MDM   Final diagnoses:  Accidental fall  Rib contusion, left, initial encounter  Left shoulder strain, initial encounter  Cervical strain, initial encounter  Head contusion, initial encounter        Isla Pence, MD 10/05/15 1655

## 2015-11-22 ENCOUNTER — Emergency Department (HOSPITAL_COMMUNITY)
Admission: EM | Admit: 2015-11-22 | Discharge: 2015-11-22 | Disposition: A | Discharge status: Home / Self Care | Payer: Medicaid Other | Attending: Emergency Medicine | Admitting: Emergency Medicine

## 2015-11-22 ENCOUNTER — Encounter (HOSPITAL_COMMUNITY): Payer: Self-pay | Admitting: Emergency Medicine

## 2015-11-22 DIAGNOSIS — F17210 Nicotine dependence, cigarettes, uncomplicated: Secondary | ICD-10-CM | POA: Insufficient documentation

## 2015-11-22 DIAGNOSIS — M1990 Unspecified osteoarthritis, unspecified site: Secondary | ICD-10-CM | POA: Insufficient documentation

## 2015-11-22 DIAGNOSIS — Z79899 Other long term (current) drug therapy: Secondary | ICD-10-CM | POA: Insufficient documentation

## 2015-11-22 DIAGNOSIS — F418 Other specified anxiety disorders: Secondary | ICD-10-CM | POA: Insufficient documentation

## 2015-11-22 DIAGNOSIS — F32A Depression, unspecified: Secondary | ICD-10-CM

## 2015-11-22 DIAGNOSIS — F419 Anxiety disorder, unspecified: Secondary | ICD-10-CM

## 2015-11-22 DIAGNOSIS — F329 Major depressive disorder, single episode, unspecified: Secondary | ICD-10-CM

## 2015-11-22 HISTORY — DX: Anxiety disorder, unspecified: F41.9

## 2015-11-22 HISTORY — DX: Depression, unspecified: F32.A

## 2015-11-22 HISTORY — DX: Major depressive disorder, single episode, unspecified: F32.9

## 2015-11-22 MED ORDER — CLONAZEPAM 1 MG PO TABS: 1.0000 mg | ORAL_TABLET | Freq: Every day | ORAL | Status: DC | PRN

## 2015-11-22 MED ORDER — CLONAZEPAM 1 MG PO TABS
1.0000 mg | ORAL_TABLET | Freq: Every day | ORAL | Status: DC | PRN
Start: 2015-11-22 — End: 2017-07-17

## 2015-11-22 NOTE — ED Notes (Signed)
Patient with request for medication refill of Prozac and Klonopin. Anxious. States has been out of meds for about 4-5 months. Is waiting to get in with [facility] and she has an appointment in late July.

## 2015-11-22 NOTE — ED Provider Notes (Signed)
CSN: [id number]     Arrival date & time 11/22/15  0941 History   By signing my name below, I, Jasmyn B. Alexander, attest that this documentation has been prepared under the direction and in the presence of Merrily Pew, MD.  Electronically Signed: Tedra Coupe. Sheppard Coil, ED Scribe. 11/22/2015. 10:41 AM.   Chief Complaint  Patient presents with  . Medication Refill   The history is provided by the patient. No language interpreter was used.   HPI Comments: Megan Gregory is a 40 y.o. female with history of Depression and Anxiety who presents to the Emergency Department requesting medication refill of Prozac and Klonopin which she has been out of for 4-5 months. Pt reports having recent increased anxiety which is contributed to her mother recently dying, losing her home, deployment of her son, and inability to visit her PCP. Pt states she is a patient at [facility], with her next appointment on 12/17/15. She reports that facility told her to "go to [facility] ED" if she is unwilling to wait until appointment to receive medication refill prescription. She does not have any other complaints at this time. Denies any SI, HI, or hallucinations.   Past Medical History  Diagnosis Date  . Arthritis   . Lupus ([place])   . Ulcer of abdomen wall ([place])   . Depressed   . Anxiety    Past Surgical History  Procedure Laterality Date  . Cesarean section    . Insertion of mesh     Family History  Problem Relation Age of Onset  . Cancer Other   . Hypertension Other   . Stroke Other   . Diabetes Other    Social History  Substance Use Topics  . Smoking status: Current Every Day Smoker -- 1.00 packs/day for 10 years    Types: Cigarettes  . Smokeless tobacco: Never Used  . Alcohol Use: No   OB History    Gravida Para Term Preterm AB TAB SAB Ectopic Multiple Living   3 2 2  1  1   2      Review of Systems  Constitutional: Negative for fever.  Psychiatric/Behavioral: Negative  for suicidal ideas, hallucinations and self-injury. The patient is nervous/anxious.   All other systems reviewed and are negative.  Allergies  Nsaids  Home Medications   Prior to Admission medications   Medication Sig Start Date End Date Taking? Authorizing Provider  acetaminophen (TYLENOL) 500 MG tablet Take 1,000 mg by mouth every 4 (four) hours as needed for mild pain or moderate pain.   Yes Historical Provider, MD  FLUoxetine (PROZAC) 40 MG capsule Take 40 mg by mouth daily.   Yes Historical Provider, MD  Oxycodone HCl 10 MG TABS Take 10 mg by mouth every 6 (six) hours as needed (pain).   Yes Historical Provider, MD  clonazePAM (KLONOPIN) 1 MG tablet Take 1 tablet (1 mg total) by mouth daily as needed for anxiety. 11/22/15   Merrily Pew, MD   BP 145/87 mmHg  Pulse 98  Temp(Src) 97.7 F (36.5 C) (Oral)  Resp 17  Ht 5\' 3"  (1.6 m)  Wt 167 lb (75.751 kg)  BMI 29.59 kg/m2  SpO2 95%  LMP 11/07/2015 Physical Exam  Constitutional: She is oriented to person, place, and time. She appears well-developed and well-nourished. No distress.  HENT:  Head: Normocephalic and atraumatic.  Eyes: Conjunctivae are normal.  Cardiovascular: Normal rate.   Pulmonary/Chest: Effort normal.  Abdominal: She exhibits no distension.  Neurological: She is alert and oriented to person, place, and time.  Skin: Skin is warm and dry.  Psychiatric:  Anxious and tearful.  Nursing note and vitals reviewed.   ED Course  Procedures (including critical care time) DIAGNOSTIC STUDIES: Oxygen Saturation is 95% on RA, adequate by my interpretation.    COORDINATION OF CARE: 10:38 AM-Discussed treatment plan which includes order of Klonopin with pt at bedside and pt agreed to plan.    MDM   Final diagnoses:  Anxiety  Depression    40 year old female here with worsening depression and anxiety. No suicidal or homicidal ideation. No hallucinations or other indications for inpatient management of her  symptoms. I did discuss her situation with the office that her psychiatrist and therapist work out of to confirm she has appointment in July and the there are no other appointment this time. I requested the patient be placed on waiting list for cancellations and he said she would call back on Thursday to make arrangements herself. In the meantime I will give her a short prescription of Klonopin to help alleviate her symptoms but will not be started on SSRI at this time as I'm not sure what the appropriate treatment for her symptoms are otherwise. Also discussed the emergency department is not the place to come for refills of her chronic  New Prescriptions: New Prescriptions   No medications on file     I have personally and contemperaneously reviewed labs and imaging and used in my decision making as above.   A medical screening exam was performed and I feel the patient has had an appropriate workup for their chief complaint at this time and likelihood of emergent condition existing is low and thus workup can continue on an outpatient basis.. Their vital signs are stable. They have been counseled on decision, discharge, follow up and which symptoms necessitate immediate return to the emergency department.  They verbally stated understanding and agreement with plan and discharged in stable condition.    I personally performed the services described in this documentation, which was scribed in my presence. The recorded information has been reviewed and is accurate.     Merrily Pew, MD 11/22/15 1050

## 2015-11-22 NOTE — ED Notes (Signed)
Pt. States anxiety due to recent events such as mother dying, losing home and not able to see primary provider until 12/17/15.

## 2017-07-17 ENCOUNTER — Emergency Department (HOSPITAL_COMMUNITY): Payer: Self-pay

## 2017-07-17 ENCOUNTER — Encounter (HOSPITAL_COMMUNITY): Payer: Self-pay | Admitting: *Deleted

## 2017-07-17 ENCOUNTER — Observation Stay (HOSPITAL_COMMUNITY)
Admission: EM | Admit: 2017-07-17 | Discharge: 2017-07-19 | Disposition: A | Discharge status: Home / Self Care | Payer: Self-pay | Attending: Internal Medicine | Admitting: Internal Medicine

## 2017-07-17 ENCOUNTER — Other Ambulatory Visit: Payer: Self-pay

## 2017-07-17 DIAGNOSIS — N939 Abnormal uterine and vaginal bleeding, unspecified: Secondary | ICD-10-CM | POA: Insufficient documentation

## 2017-07-17 DIAGNOSIS — N920 Excessive and frequent menstruation with regular cycle: Secondary | ICD-10-CM

## 2017-07-17 DIAGNOSIS — F32A Depression, unspecified: Secondary | ICD-10-CM

## 2017-07-17 DIAGNOSIS — D62 Acute posthemorrhagic anemia: Secondary | ICD-10-CM

## 2017-07-17 DIAGNOSIS — F418 Other specified anxiety disorders: Secondary | ICD-10-CM | POA: Insufficient documentation

## 2017-07-17 DIAGNOSIS — F329 Major depressive disorder, single episode, unspecified: Secondary | ICD-10-CM

## 2017-07-17 DIAGNOSIS — R42 Dizziness and giddiness: Secondary | ICD-10-CM | POA: Insufficient documentation

## 2017-07-17 DIAGNOSIS — F17210 Nicotine dependence, cigarettes, uncomplicated: Secondary | ICD-10-CM | POA: Insufficient documentation

## 2017-07-17 DIAGNOSIS — D649 Anemia, unspecified: Secondary | ICD-10-CM | POA: Insufficient documentation

## 2017-07-17 DIAGNOSIS — K219 Gastro-esophageal reflux disease without esophagitis: Secondary | ICD-10-CM | POA: Insufficient documentation

## 2017-07-17 DIAGNOSIS — R519 Headache, unspecified: Secondary | ICD-10-CM | POA: Diagnosis present

## 2017-07-17 DIAGNOSIS — R270 Ataxia, unspecified: Secondary | ICD-10-CM | POA: Insufficient documentation

## 2017-07-17 DIAGNOSIS — R260 Ataxic gait: Principal | ICD-10-CM | POA: Insufficient documentation

## 2017-07-17 DIAGNOSIS — D509 Iron deficiency anemia, unspecified: Secondary | ICD-10-CM | POA: Diagnosis present

## 2017-07-17 DIAGNOSIS — I951 Orthostatic hypotension: Secondary | ICD-10-CM | POA: Insufficient documentation

## 2017-07-17 DIAGNOSIS — D598 Other acquired hemolytic anemias: Secondary | ICD-10-CM | POA: Insufficient documentation

## 2017-07-17 DIAGNOSIS — Z79899 Other long term (current) drug therapy: Secondary | ICD-10-CM | POA: Insufficient documentation

## 2017-07-17 DIAGNOSIS — G8929 Other chronic pain: Secondary | ICD-10-CM | POA: Insufficient documentation

## 2017-07-17 DIAGNOSIS — R634 Abnormal weight loss: Secondary | ICD-10-CM | POA: Insufficient documentation

## 2017-07-17 DIAGNOSIS — R51 Headache: Secondary | ICD-10-CM | POA: Insufficient documentation

## 2017-07-17 DIAGNOSIS — D259 Leiomyoma of uterus, unspecified: Secondary | ICD-10-CM | POA: Insufficient documentation

## 2017-07-17 HISTORY — DX: Leiomyoma of uterus, unspecified: D25.9

## 2017-07-17 LAB — CBC WITH DIFFERENTIAL/PLATELET
BASOS PCT: 1 %
Basophils Absolute: 0 10*3/uL (ref 0.0–0.1)
EOS ABS: 0.1 10*3/uL (ref 0.0–0.7)
EOS PCT: 2 %
HCT: 28.7 % — ABNORMAL LOW (ref 36.0–46.0)
Hemoglobin: 8.5 g/dL — ABNORMAL LOW (ref 12.0–15.0)
Lymphocytes Relative: 28 %
Lymphs Abs: 1.8 10*3/uL (ref 0.7–4.0)
MCH: 22.5 pg — ABNORMAL LOW (ref 26.0–34.0)
MCHC: 29.6 g/dL — ABNORMAL LOW (ref 30.0–36.0)
MCV: 75.9 fL — ABNORMAL LOW (ref 78.0–100.0)
Monocytes Absolute: 0.6 10*3/uL (ref 0.1–1.0)
Monocytes Relative: 9 %
Neutro Abs: 3.8 10*3/uL (ref 1.7–7.7)
Neutrophils Relative %: 60 %
PLATELETS: 508 10*3/uL — AB (ref 150–400)
RBC: 3.78 MIL/uL — AB (ref 3.87–5.11)
RDW: 17.5 % — AB (ref 11.5–15.5)
WBC: 6.3 10*3/uL (ref 4.0–10.5)

## 2017-07-17 LAB — URINALYSIS, ROUTINE W REFLEX MICROSCOPIC
Bilirubin Urine: NEGATIVE
GLUCOSE, UA: NEGATIVE mg/dL
Ketones, ur: NEGATIVE mg/dL
Leukocytes, UA: NEGATIVE
NITRITE: NEGATIVE
PH: 5 (ref 5.0–8.0)
PROTEIN: NEGATIVE mg/dL
Specific Gravity, Urine: 1.003 — ABNORMAL LOW (ref 1.005–1.030)

## 2017-07-17 LAB — HEPATIC FUNCTION PANEL
ALT: 12 U/L — AB (ref 14–54)
AST: 21 U/L (ref 15–41)
Albumin: 4.7 g/dL (ref 3.5–5.0)
Alkaline Phosphatase: 71 U/L (ref 38–126)
Bilirubin, Direct: <0.1 mg/dL — ABNORMAL LOW (ref 0.1–0.5)
TOTAL PROTEIN: 7.8 g/dL (ref 6.5–8.1)
Total Bilirubin: 0.3 mg/dL (ref 0.3–1.2)

## 2017-07-17 LAB — BASIC METABOLIC PANEL
Anion gap: 12 (ref 5–15)
BUN: 8 mg/dL (ref 6–20)
CALCIUM: 9.5 mg/dL (ref 8.9–10.3)
CO2: 22 mmol/L (ref 22–32)
Chloride: 103 mmol/L (ref 101–111)
Creatinine, Ser: 0.86 mg/dL (ref 0.44–1.00)
GFR calc Af Amer: >60 mL/min (ref >60)
Glucose, Bld: 90 mg/dL (ref 65–99)
POTASSIUM: 3.5 mmol/L (ref 3.5–5.1)
SODIUM: 137 mmol/L (ref 135–145)

## 2017-07-17 LAB — PREGNANCY, URINE: Preg Test, Ur: NEGATIVE

## 2017-07-17 LAB — RAPID URINE DRUG SCREEN, HOSP PERFORMED
AMPHETAMINES: POSITIVE — AB
Barbiturates: NOT DETECTED
Benzodiazepines: NOT DETECTED
COCAINE: NOT DETECTED
OPIATES: NOT DETECTED
TETRAHYDROCANNABINOL: NOT DETECTED

## 2017-07-17 LAB — WET PREP, GENITAL
CLUE CELLS WET PREP: NONE SEEN
SPERM: NONE SEEN
TRICH WET PREP: NONE SEEN
Yeast Wet Prep HPF POC: NONE SEEN

## 2017-07-17 LAB — LIPASE, BLOOD: Lipase: 18 U/L (ref 11–51)

## 2017-07-17 LAB — TSH: TSH: 0.993 u[IU]/mL (ref 0.350–4.500)

## 2017-07-17 MED ORDER — ACETAMINOPHEN 500 MG PO TABS
1000.0000 mg | ORAL_TABLET | Freq: Four times a day (QID) | ORAL | Status: DC | PRN
  Administered 2017-07-18: 1000 mg via ORAL
  Filled 2017-07-17 (×3): qty 2

## 2017-07-17 MED ORDER — ONDANSETRON HCL 4 MG PO TABS: 4.0000 mg | ORAL_TABLET | Freq: Four times a day (QID) | ORAL | Status: DC | PRN

## 2017-07-17 MED ORDER — ONDANSETRON HCL 4 MG/2ML IJ SOLN: 4.0000 mg | Freq: Four times a day (QID) | INTRAMUSCULAR | Status: DC | PRN

## 2017-07-17 MED ORDER — DIPHENHYDRAMINE HCL 50 MG/ML IJ SOLN: 25.0000 mg | Freq: Once | INTRAMUSCULAR | Status: DC

## 2017-07-17 MED ORDER — FERROUS SULFATE 325 (65 FE) MG PO TABS
325.0000 mg | ORAL_TABLET | Freq: Every day | ORAL | Status: DC
  Administered 2017-07-18 – 2017-07-19 (×2): 325 mg via ORAL
  Filled 2017-07-17 (×2): qty 1

## 2017-07-17 MED ORDER — IBUPROFEN 400 MG PO TABS
400.0000 mg | ORAL_TABLET | Freq: Once | ORAL | Status: DC
  Filled 2017-07-17: qty 1

## 2017-07-17 MED ORDER — GABAPENTIN 300 MG PO CAPS
600.0000 mg | ORAL_CAPSULE | Freq: Four times a day (QID) | ORAL | Status: DC
  Administered 2017-07-17 – 2017-07-19 (×7): 600 mg via ORAL
  Filled 2017-07-17 (×7): qty 2

## 2017-07-17 MED ORDER — NICOTINE 14 MG/24HR TD PT24
14.0000 mg | MEDICATED_PATCH | Freq: Every day | TRANSDERMAL | Status: DC
  Administered 2017-07-18 – 2017-07-19 (×2): 14 mg via TRANSDERMAL
  Filled 2017-07-17 (×2): qty 1

## 2017-07-17 MED ORDER — CYCLOBENZAPRINE HCL 10 MG PO TABS: 10.0000 mg | ORAL_TABLET | Freq: Once | ORAL | Status: DC

## 2017-07-17 MED ORDER — ENSURE ENLIVE PO LIQD: 237.0000 mL | Freq: Two times a day (BID) | ORAL | Status: DC

## 2017-07-17 MED ORDER — IBUPROFEN 400 MG PO TABS
600.0000 mg | ORAL_TABLET | Freq: Four times a day (QID) | ORAL | Status: DC | PRN
Start: 2017-07-17 — End: 2017-07-17

## 2017-07-17 MED ORDER — ACETAMINOPHEN 650 MG RE SUPP: 650.0000 mg | Freq: Four times a day (QID) | RECTAL | Status: DC | PRN

## 2017-07-17 MED ORDER — SODIUM CHLORIDE 0.9 % IV SOLN
510.0000 mg | Freq: Once | INTRAVENOUS | Status: DC
  Filled 2017-07-17: qty 17

## 2017-07-17 MED ORDER — BUPRENORPHINE HCL-NALOXONE HCL 8-2 MG SL SUBL
1.0000 | SUBLINGUAL_TABLET | Freq: Every day | SUBLINGUAL | Status: DC
  Administered 2017-07-18 – 2017-07-19 (×2): 1 via SUBLINGUAL
  Filled 2017-07-17 (×2): qty 1

## 2017-07-17 MED ORDER — DEXAMETHASONE 4 MG PO TABS: 10.0000 mg | ORAL_TABLET | Freq: Once | ORAL | Status: DC

## 2017-07-17 MED ORDER — SODIUM CHLORIDE 0.9 % IV SOLN
INTRAVENOUS | Status: DC
  Administered 2017-07-17 – 2017-07-18 (×2): via INTRAVENOUS

## 2017-07-17 MED ORDER — BUPROPION HCL ER (XL) 150 MG PO TB24
150.0000 mg | ORAL_TABLET | Freq: Every day | ORAL | Status: DC
Start: 2017-07-18 — End: 2017-07-19
  Administered 2017-07-18 – 2017-07-19 (×2): 150 mg via ORAL
  Filled 2017-07-17 (×2): qty 1

## 2017-07-17 MED ORDER — PANTOPRAZOLE SODIUM 40 MG PO TBEC
40.0000 mg | DELAYED_RELEASE_TABLET | Freq: Every day | ORAL | Status: DC
  Administered 2017-07-18 – 2017-07-19 (×2): 40 mg via ORAL
  Filled 2017-07-17 (×2): qty 1

## 2017-07-17 MED ORDER — FLUOXETINE HCL 20 MG PO CAPS
60.0000 mg | ORAL_CAPSULE | Freq: Every day | ORAL | Status: DC
  Administered 2017-07-18 – 2017-07-19 (×2): 60 mg via ORAL
  Filled 2017-07-17 (×2): qty 3

## 2017-07-17 MED ORDER — IBUPROFEN 400 MG PO TABS
600.0000 mg | ORAL_TABLET | Freq: Once | ORAL | Status: AC
  Administered 2017-07-17: 600 mg via ORAL

## 2017-07-17 MED ORDER — SODIUM CHLORIDE 0.9 % IV BOLUS (SEPSIS)
1000.0000 mL | Freq: Once | INTRAVENOUS | Status: AC
  Administered 2017-07-17: 1000 mL via INTRAVENOUS

## 2017-07-17 NOTE — ED Notes (Signed)
Hospitalist at bedside 

## 2017-07-17 NOTE — ED Notes (Signed)
Pt transported to CT and x-ray  

## 2017-07-17 NOTE — ED Notes (Signed)
Dr at bedside performing pelvic exam

## 2017-07-17 NOTE — ED Provider Notes (Addendum)
[facility] EMERGENCY DEPARTMENT Provider Note   CSN: [id number] Arrival date & time: 07/17/17  1242     History   Chief Complaint Chief Complaint  Patient presents with  . Gait Problem    HPI Megan Gregory is a 42 y.o. female.  Patient presents with ataxia for approximately 3 weeks.  She was evaluated at an urgent care center in [facility] for this problem with no apparent resolution.  She reports vaginal bleeding for 3 1/2 weeks which she states is secondary to a known history of fibroids.  Past medical history includes anemia, rheumatoid arthritis, peptic ulcer disease.  Review of systems positive for diarrhea and 45 pound weight loss in the past 4 months.  Social history:  Educational psychologist.  Smoker.  No fever, sweats, chills, chest pain, dyspnea, dysuria, stiff neck.      Past Medical History:  Diagnosis Date  . Anxiety   . Arthritis   . Depressed   . Lupus   . Ulcer of abdomen wall ([place])     There are no active problems to display for this patient.   Past Surgical History:  Procedure Laterality Date  . CESAREAN SECTION    . INSERTION OF MESH      OB History    Gravida Para Term Preterm AB Living   3 2 2   1 2    SAB TAB Ectopic Multiple Live Births   1               Home Medications    Prior to Admission medications   Medication Sig Start Date End Date Taking? Authorizing Provider  buprenorphine (SUBUTEX) 8 MG SUBL SL tablet Place 8 mg under the tongue daily.   Yes [provider]  buPROPion (WELLBUTRIN XL) 150 MG 24 hr tablet Take 150 mg by mouth daily.   Yes [provider]  Dexlansoprazole 30 MG capsule Take 30 mg by mouth daily.   Yes [provider]  ferrous sulfate 325 (65 FE) MG tablet Take 325 mg by mouth daily with breakfast.   Yes [provider]  FLUoxetine (PROZAC) 20 MG capsule Take 60 mg by mouth daily.   Yes [provider]  gabapentin (NEURONTIN) 600 MG tablet Take 600 mg by mouth 4 (four) times  daily.   Yes [provider]    Family History Family History  Problem Relation Age of Onset  . Cancer Other   . Hypertension Other   . Stroke Other   . Diabetes Other     Social History Social History   Tobacco Use  . Smoking status: Current Every Day Smoker    Packs/day: 1.00    Years: 10.00    Pack years: 10.00    Types: Cigarettes  . Smokeless tobacco: Never Used  Substance Use Topics  . Alcohol use: No  . Drug use: No     Allergies   Nsaids   Review of Systems Review of Systems  All other systems reviewed and are negative.    Physical Exam Updated Vital Signs BP (!) 107/55   Pulse 73   Temp 98.9 F (37.2 C) (Oral)   Resp 15   Ht 5\' 3"  (1.6 m)   Wt 66.7 kg (147 lb)   LMP 06/17/2017   SpO2 99%   BMI 26.04 kg/m   Physical Exam  Constitutional: She is oriented to person, place, and time. She appears well-developed and well-nourished.  HENT:  Head: Normocephalic and atraumatic.  Eyes: Conjunctivae  are normal.  Neck: Neck supple.  Cardiovascular: Normal rate and regular rhythm.  Pulmonary/Chest: Effort normal and breath sounds normal.  Abdominal: Soft. Bowel sounds are normal.  Genitourinary:  Genitourinary Comments: Pelvic exam: Normal external genitalia.  There is a small amount of blood in the vault, but no active bleeding from the cervical loss.  No cervical motion tenderness.  Probable fibroids palpated left greater than right  Musculoskeletal: Normal range of motion.  Neurological: She is alert and oriented to person, place, and time.  Patient is unable to ambulate independently secondary to ataxia  Skin: Skin is warm and dry.  Psychiatric:  Anxious  Nursing note and vitals reviewed.    ED Treatments / Results  Labs (all labs ordered are listed, but only abnormal results are displayed) Labs Reviewed  CBC WITH DIFFERENTIAL/PLATELET - Abnormal; Notable for the following components:      Result Value   RBC 3.78 (*)     Hemoglobin 8.5 (*)    HCT 28.7 (*)    MCV 75.9 (*)    MCH 22.5 (*)    MCHC 29.6 (*)    RDW 17.5 (*)    Platelets 508 (*)    All other components within normal limits  HEPATIC FUNCTION PANEL - Abnormal; Notable for the following components:   ALT 12 (*)    Bilirubin, Direct <0.1 (*)    All other components within normal limits  WET PREP, GENITAL  BASIC METABOLIC PANEL  LIPASE, BLOOD  TSH  URINALYSIS, ROUTINE W REFLEX MICROSCOPIC  PREGNANCY, URINE  GC/CHLAMYDIA PROBE AMP ([place]) NOT AT [facility]    EKG  EKG Interpretation None       Radiology Dg Chest 2 View  Result Date: 07/17/2017 CLINICAL DATA:  Unexplained weight loss.  Vertigo. EXAM: CHEST  2 VIEW COMPARISON:  02/08/2008 FINDINGS: The heart size and mediastinal contours are within normal limits. Both lungs are clear. The visualized skeletal structures are unremarkable. IMPRESSION: No active cardiopulmonary disease. Electronically Signed   By: Earle Gell M.D.   On: 07/17/2017 14:57    Procedures Procedures (including critical care time)  Medications Ordered in ED Medications - No data to display   Initial Impression / Assessment and Plan / ED Course  I have reviewed the triage vital signs and the nursing notes.  Pertinent labs & imaging results that were available during my care of the patient were reviewed by me and considered in my medical decision making (see chart for details).     Patient presents with persistent ataxia for 3 weeks.  She also complains of vaginal bleeding, weight loss, and anemia.  She may need a pelvic ultrasound to assess the vaginal bleeding and MRI of the brain if symptoms persist.  Will admit to general medicine.  Discussed with Dr. Marily Memos.  Final Clinical Impressions(s) / ED Diagnoses   Final diagnoses:  Ataxia  Weight loss  Anemia, unspecified type  Vaginal bleeding    ED Discharge Orders    None       Nat Christen, MD 07/17/17 1534    Nat Christen, MD 07/17/17  [phone]

## 2017-07-17 NOTE — H&P (Signed)
History and Physical    Megan Gregory:[id number] DOB: [birth year] DOA: 07/17/2017  PCP: [facility] Patient coming from: home  Chief Complaint: dizziness  HPI: Megan Gregory is a 42 y.o. female with medical history significant of anxiety, depression, uterine fibroids. Patient presenting with three-week history of complaints of dizziness described as the room spinning. Gradual onset. Symptoms were not associated with any recent URI or other illnesses/symptoms. Symptoms came on while at work. Typically comes on when patient goes from lying or sitting to standing. Improves after patient stands up for several seconds holding onto something. Occasionally symptoms will come on while sitting. Symptoms are not better or worse and on chart. Associated with severe headache and has become constant. Ibuprofen 800 mg 6 times daily with limited improvement.  Endorsing multiple falls secondary to the dizziness and slight weakness of the L hand.   Patient endorses smoking approximately half pack per day of nicotine between regular cigarettes and he cigarettes.   Patient with a long-standing history of uterine fibroids and dysmenorrhea. States that hemoglobin stayed between 6 and 8. Current episode of bleeding is not unusual for her. Compliant with her home iron.   ED Course: objective findings below.   Review of Systems: As per HPI otherwise all other systems reviewed and are negative  Ambulatory Status:No restrictions  Past Medical History:  Diagnosis Date  . Anxiety   . Arthritis   . Depressed   . Uterine fibroid     Past Surgical History:  Procedure Laterality Date  . CESAREAN SECTION    . INSERTION OF MESH      Social History   Socioeconomic History  . Marital status: Single    Spouse name: Not on file  . Number of children: Not on file  . Years of education: Not on file  . Highest education level: Not on file  Social Needs  . Financial  resource strain: Not on file  . Food insecurity - worry: Not on file  . Food insecurity - inability: Not on file  . Transportation needs - medical: Not on file  . Transportation needs - non-medical: Not on file  Occupational History  . Not on file  Tobacco Use  . Smoking status: Current Every Day Smoker    Packs/day: 1.00    Years: 10.00    Pack years: 10.00    Types: Cigarettes  . Smokeless tobacco: Never Used  Substance and Sexual Activity  . Alcohol use: No  . Drug use: No  . Sexual activity: Not on file  Other Topics Concern  . Not on file  Social History Narrative  . Not on file    Allergies  Allergen Reactions  . Nsaids Other (See Comments)    Ulcer    Family History  Problem Relation Age of Onset  . Cancer Other   . Hypertension Other   . Stroke Other   . Diabetes Other   . Lupus Mother       Prior to Admission medications   Medication Sig Start Date End Date Taking? Authorizing Provider  buprenorphine (SUBUTEX) 8 MG SUBL SL tablet Place 8 mg under the tongue daily.   Yes [provider]  buPROPion (WELLBUTRIN XL) 150 MG 24 hr tablet Take 150 mg by mouth daily.   Yes [provider]  Dexlansoprazole 30 MG capsule Take 30 mg by mouth daily.   Yes [provider]  ferrous sulfate 325 (65 FE) MG tablet Take  325 mg by mouth daily with breakfast.   Yes [provider]  FLUoxetine (PROZAC) 20 MG capsule Take 60 mg by mouth daily.   Yes [provider]  gabapentin (NEURONTIN) 600 MG tablet Take 600 mg by mouth 4 (four) times daily.   Yes [provider]    Physical Exam: Vitals:   07/17/17 1400 07/17/17 1415 07/17/17 1430 07/17/17 1615  BP: (!) 153/75  (!) 107/55 129/63  Pulse: 77 73    Resp: 15   (!) 35  Temp:      TempSrc:      SpO2: 99% 99%    Weight:      Height:         General:  Appears anxious and comfortable Eyes: PERRL, EOMI, normal lids, iris ENT:  grossly normal hearing, lips & tongue,  mmm Neck:  no LAD, masses or thyromegaly Cardiovascular:  RRR, III/VI systolic murmur. No LE edema.  Respiratory:  CTA bilaterally, no w/r/r. Normal respiratory effort. Abdomen:  soft, ntnd, NABS Skin:  no rash or induration seen on limited exam Musculoskeletal:  grossly normal tone BUE/BLE, good ROM, no bony abnormality Psychiatric:  grossly normal mood and affect, speech fluent and appropriate, AOx3 Neurologic:  CN 2-12 grossly intact,slight decrease in finger to nose but no true dysmetria. Rhomberg equivocal, ambulates w/ wide gate. L grip strength 4/5 and R grip strength 5/5   Labs on Admission: I have personally reviewed following labs and imaging studies  CBC: Recent Labs  Lab 07/17/17 1302  WBC 6.3  NEUTROABS 3.8  HGB 8.5*  HCT 28.7*  MCV 75.9*  PLT 007*   Basic Metabolic Panel: Recent Labs  Lab 07/17/17 1302  NA 137  K 3.5  CL 103  CO2 22  GLUCOSE 90  BUN 8  CREATININE 0.86  CALCIUM 9.5   GFR: Estimated Creatinine Clearance: 77.4 mL/min (by C-G formula based on SCr of 0.86 mg/dL). Liver Function Tests: Recent Labs  Lab 07/17/17 1302  AST 21  ALT 12*  ALKPHOS 71  BILITOT 0.3  PROT 7.8  ALBUMIN 4.7   Recent Labs  Lab 07/17/17 1302  LIPASE 18   No results for input(s): AMMONIA in the last 168 hours. Coagulation Profile: No results for input(s): INR, PROTIME in the last 168 hours. Cardiac Enzymes: No results for input(s): CKTOTAL, CKMB, CKMBINDEX, TROPONINI in the last 168 hours. BNP (last 3 results) No results for input(s): PROBNP in the last 8760 hours. HbA1C: No results for input(s): HGBA1C in the last 72 hours. CBG: No results for input(s): GLUCAP in the last 168 hours. Lipid Profile: No results for input(s): CHOL, HDL, LDLCALC, TRIG, CHOLHDL, LDLDIRECT in the last 72 hours. Thyroid Function Tests: Recent Labs    07/17/17 1302  TSH 0.993   Anemia Panel: No results for input(s): VITAMINB12, FOLATE, FERRITIN, TIBC, IRON, RETICCTPCT in  the last 72 hours. Urine analysis:    Component Value Date/Time   COLORURINE YELLOW 07/17/2017 1305   APPEARANCEUR HAZY (A) 07/17/2017 1305   LABSPEC 1.003 (L) 07/17/2017 1305   PHURINE 5.0 07/17/2017 1305   GLUCOSEU NEGATIVE 07/17/2017 1305   HGBUR LARGE (A) 07/17/2017 1305   BILIRUBINUR NEGATIVE 07/17/2017 1305   KETONESUR NEGATIVE 07/17/2017 1305   PROTEINUR NEGATIVE 07/17/2017 1305   UROBILINOGEN 0.2 02/08/2008 1814   NITRITE NEGATIVE 07/17/2017 1305   LEUKOCYTESUR NEGATIVE 07/17/2017 1305    Creatinine Clearance: Estimated Creatinine Clearance: 77.4 mL/min (by C-G formula based on SCr of 0.86 mg/dL).  Sepsis Labs: @  LABRCNTIP(procalcitonin:4,lacticidven:4) ) Recent Results (from the past 240 hour(s))  Wet prep, genital     Status: Abnormal   Collection Time: 07/17/17  2:58 PM  Result Value Ref Range Status   Yeast Wet Prep HPF POC NONE SEEN NONE SEEN Final   Trich, Wet Prep NONE SEEN NONE SEEN Final   Clue Cells Wet Prep HPF POC NONE SEEN NONE SEEN Final   WBC, Wet Prep HPF POC MODERATE (A) NONE SEEN Final   Sperm NONE SEEN  Final    Comment: Performed at [facility], [address], [place], [place] [address]     Radiological Exams on Admission: Dg Chest 2 View  Result Date: 07/17/2017 CLINICAL DATA:  Unexplained weight loss.  Vertigo. EXAM: CHEST  2 VIEW COMPARISON:  02/08/2008 FINDINGS: The heart size and mediastinal contours are within normal limits. Both lungs are clear. The visualized skeletal structures are unremarkable. IMPRESSION: No active cardiopulmonary disease. Electronically Signed   By: Earle Gell M.D.   On: 07/17/2017 14:57   Ct Head Wo Contrast  Result Date: 07/17/2017 CLINICAL DATA:  Ataxia, slowly progressive or long duration. 3 falls during the last 3 weeks. EXAM: CT HEAD WITHOUT CONTRAST TECHNIQUE: Contiguous axial images were obtained from the base of the skull through the vertex without intravenous contrast. COMPARISON:  CT head without contrast  10/05/2015 FINDINGS: Brain: No acute infarct, hemorrhage, or mass lesion is present. The ventricles are of normal size. No significant extraaxial fluid collection is present. No significant white matter disease is present. The brainstem and cerebellum are normal. Vascular: No hyperdense vessel or unexpected calcification. Skull: Calvarium is intact. No focal lytic or blastic lesions are present. Sinuses/Orbits: The paranasal sinuses the mastoid air cells are clear. Frontal sinuses are not aerated. The inner ear structures are normally formed. The superior and lateral semicircular canals appear to be covered. The globes and orbits are within normal limits. IMPRESSION: Negative CT of the head. Electronically Signed   By: San Morelle M.D.   On: 07/17/2017 15:13    Assessment/Plan Active Problems:   Dizziness   Dizziness: unclear etiology but suspect orthostatic as the primary cause. Doubt stroke though L hand 4/5 grip strength is slightly concerning. Additionally if pt had a stroke she is effectively 3 wks out from initial stroke w/o worsening sx. Doubt vestibular neuritis/labrynthitis as no recent illness and sx are not constant. Pt seems very anxious and while she denies elicit drug use, this is a consideration. Anemia is a possibility though pt states her Hgb is near her baseline. Doubt menieres as pt w/o hearing loss. Orthostatic VS + w/ [id number] drop in systolic pressure and pt very symptomatic on standing. CT w/o acute process noted - IVF - Consider MRI if pt still admitted on Monday. Also consider CT perfusion study though pt just radiated w/ non-con CT at time of admission.  - HA treatment as below  - Orthostatic VS in am  HA: acute on chronic. Suspect some amount of rebound from medication overuse. CT head nml as above - Decadron x1, zofran, benadryl, flexeril, ibuprofen, tylenol  Uterine bleeding: secondary to known h/o fibroids. No sign of infectious process based on vaginal exam and  labs. Hgb 8.6 near baseline. Has required transfusions in the past.  - CBC in am - f/u outpt w/ GYN for hysterectomy  CHronic pain: on subutex - UDS - continue subutex, neurontin  Anemia: microcytic secondary to uterine bleeding from fibroids - CBC in am - Iron infusion x1  GERD: - continue PPI  Depression:- - continue wellbutrin and prozac.   Tobacco: - nicotine patch    DVT prophylaxis: SCD  Code Status: full  Family Communication: boyfriend  Disposition Plan: pending improvement in condition and workup  Consults called: none  Admission status: observation    Waldemar Dickens MD [facility]  If 7PM-7AM, please contact night-coverage [email or web address] Password [facility]  07/17/2017, 5:07 PM

## 2017-07-17 NOTE — ED Triage Notes (Signed)
Pt with unbalanced for 3 weeks, has been seen at [facility] in [place] due to a fall. Pt states she has a inner ear problem.   Pt with vaginal bleeding for 2 weeks.  C/o diarrhea then constipation-lbm 2 1/2 weeks.

## 2017-07-18 DIAGNOSIS — F329 Major depressive disorder, single episode, unspecified: Secondary | ICD-10-CM

## 2017-07-18 DIAGNOSIS — D62 Acute posthemorrhagic anemia: Secondary | ICD-10-CM

## 2017-07-18 DIAGNOSIS — F32A Depression, unspecified: Secondary | ICD-10-CM

## 2017-07-18 DIAGNOSIS — N939 Abnormal uterine and vaginal bleeding, unspecified: Secondary | ICD-10-CM

## 2017-07-18 DIAGNOSIS — K219 Gastro-esophageal reflux disease without esophagitis: Secondary | ICD-10-CM

## 2017-07-18 DIAGNOSIS — N920 Excessive and frequent menstruation with regular cycle: Secondary | ICD-10-CM

## 2017-07-18 LAB — CBC
HCT: 25.7 % — ABNORMAL LOW (ref 36.0–46.0)
Hemoglobin: 7.4 g/dL — ABNORMAL LOW (ref 12.0–15.0)
MCH: 22 pg — AB (ref 26.0–34.0)
MCHC: 28.8 g/dL — AB (ref 30.0–36.0)
MCV: 76.5 fL — AB (ref 78.0–100.0)
PLATELETS: 387 10*3/uL (ref 150–400)
RBC: 3.36 MIL/uL — ABNORMAL LOW (ref 3.87–5.11)
RDW: 17.8 % — AB (ref 11.5–15.5)
WBC: 4.8 10*3/uL (ref 4.0–10.5)

## 2017-07-18 LAB — IRON AND TIBC
IRON: 26 ug/dL — AB (ref 28–170)
Saturation Ratios: 8 % — ABNORMAL LOW (ref 10.4–31.8)
TIBC: 336 ug/dL (ref 250–450)
UIBC: 310 ug/dL

## 2017-07-18 LAB — BASIC METABOLIC PANEL
Anion gap: 9 (ref 5–15)
BUN: 6 mg/dL (ref 6–20)
CALCIUM: 8.5 mg/dL — AB (ref 8.9–10.3)
CHLORIDE: 113 mmol/L — AB (ref 101–111)
CO2: 22 mmol/L (ref 22–32)
CREATININE: 0.74 mg/dL (ref 0.44–1.00)
GFR calc Af Amer: >60 mL/min (ref >60)
GFR calc non Af Amer: >60 mL/min (ref >60)
Glucose, Bld: 85 mg/dL (ref 65–99)
Potassium: 3.5 mmol/L (ref 3.5–5.1)
Sodium: 144 mmol/L (ref 135–145)

## 2017-07-18 LAB — RETICULOCYTES
RBC.: 3.07 MIL/uL — AB (ref 3.87–5.11)
RETIC COUNT ABSOLUTE: 18.4 10*3/uL — AB (ref 19.0–186.0)
Retic Ct Pct: 0.6 % (ref 0.4–3.1)

## 2017-07-18 LAB — HIV ANTIBODY (ROUTINE TESTING W REFLEX): HIV Screen 4th Generation wRfx: NONREACTIVE

## 2017-07-18 LAB — PREPARE RBC (CROSSMATCH)

## 2017-07-18 LAB — ABO/RH: ABO/RH(D): A POS

## 2017-07-18 LAB — HEMOGLOBIN AND HEMATOCRIT, BLOOD
HEMATOCRIT: 23.8 % — AB (ref 36.0–46.0)
HEMOGLOBIN: 6.9 g/dL — AB (ref 12.0–15.0)

## 2017-07-18 LAB — MAGNESIUM: Magnesium: 1.8 mg/dL (ref 1.7–2.4)

## 2017-07-18 LAB — PHOSPHORUS: Phosphorus: 3.3 mg/dL (ref 2.5–4.6)

## 2017-07-18 LAB — FOLATE: FOLATE: 10.9 ng/mL (ref >5.9)

## 2017-07-18 LAB — VITAMIN B12: VITAMIN B 12: 259 pg/mL (ref 180–914)

## 2017-07-18 LAB — FERRITIN: Ferritin: 4 ng/mL — ABNORMAL LOW (ref 11–307)

## 2017-07-18 MED ORDER — LORATADINE 10 MG PO TABS
10.0000 mg | ORAL_TABLET | Freq: Every day | ORAL | Status: DC | PRN
  Administered 2017-07-18: 10 mg via ORAL
  Filled 2017-07-18: qty 1

## 2017-07-18 MED ORDER — DIPHENHYDRAMINE HCL 25 MG PO CAPS
25.0000 mg | ORAL_CAPSULE | Freq: Once | ORAL | Status: AC
  Administered 2017-07-18: 25 mg via ORAL
  Filled 2017-07-18: qty 1

## 2017-07-18 MED ORDER — ACETAMINOPHEN 325 MG PO TABS
650.0000 mg | ORAL_TABLET | Freq: Once | ORAL | Status: AC
  Administered 2017-07-18: 650 mg via ORAL
  Filled 2017-07-18: qty 2

## 2017-07-18 MED ORDER — SODIUM CHLORIDE 0.9 % IV SOLN: Freq: Once | INTRAVENOUS | Status: DC

## 2017-07-18 MED ORDER — IBUPROFEN 600 MG PO TABS
600.0000 mg | ORAL_TABLET | Freq: Once | ORAL | Status: AC
  Administered 2017-07-18: 600 mg via ORAL
  Filled 2017-07-18: qty 1

## 2017-07-18 NOTE — Progress Notes (Addendum)
PROGRESS NOTE    Megan Gregory  WNU:[id number] DOB: 1975/10/27 DOA: 07/17/2017 PCP: [facility]    Brief Narrative:  41 year old female history of anxiety, depression, uterine fibroids presented with a 3-week history of dizziness described as the room spinning, history of menorrhagia.  Patient endorsed multiple falls secondary to dizziness and weakness slightly in the left hand.  Patient with history of long-standing uterine fibroids and dysmenorrhea.  Hemoglobin on admission 8.4.  Patient also noted to be orthostatic and hydrated with IV fluids.   Assessment & Plan:   Principal Problem:   Orthostatic hypotension Active Problems:   Vertigo   Vaginal bleeding   Microcytic anemia   Severe headache  #1 orthostatic hypotension Likely secondary to menorrhagia and dehydration.  Clinically improved with hydration.  Continue IV fluids.  Follow H&H.  2.  Vertigo Improved.  Secondary to problem #1.  Follow.  3.  Acute blood loss anemia/microcytic anemia secondary to uterine bleeding from fibroids Secondary to menorrhagia.  Anemia panel pending.  Hemoglobin trending down.  Was 8.5 on admission down to 7.4 this morning.  Repeat H&H at 4 PM was 6.9.  Status post IV Feraheme.  Will transfuse 2 units packed red blood cells.  Follow H&H.  4.  Menorrhagia/uterine bleeding secondary to history of fibroids Outpatient follow-up with OB/GYN.  5.  Severe headache Likely in part to have a complement of rebound from medication overuse.  CT head normal.  Patient received a dose of Decadron x1, Zofran, Benadryl, Flexeril, ibuprofen, Tylenol.  Follow.  6.  Gastroesophageal reflux disease PPI.  7.  Depression Continue Wellbutrin and Prozac.  8.  Tobacco abuse Tobacco cessation.  Continue nicotine patch.  9.  Chronic pain Patient on Subutex prior to admission.  Continue Subutex and Neurontin.   DVT prophylaxis: scd Code Status: Full Family Communication: updated  patient.  No family at bedside. Disposition Plan: Likely home once clinically improved. Hopefully in 24 hours.   Consultants:   None  Procedures:   CT head 07/17/2017  CXR 07/17/2017  Antimicrobials:   None   Subjective: Patient states dizziness improved. Patient states menorrhagia.   Objective: Vitals:   07/17/17 1922 07/17/17 2300 07/18/17 0600 07/18/17 1508  BP:  (!) 137/59 112/85 (!) 107/57  Pulse:  77 75 60  Resp:  17 16 16   Temp:  98.4 F (36.9 C) (!) 97.4 F (36.3 C) (!) 97.5 F (36.4 C)  TempSrc:  Oral Oral Oral  SpO2: 97% 98% 99% 99%  Weight:      Height:        Intake/Output Summary (Last 24 hours) at 07/18/2017 1758 Last data filed at 07/18/2017 0300 Gross per 24 hour  Intake 850 ml  Output -  Net 850 ml   Filed Weights   07/17/17 1256  Weight: 66.7 kg (147 lb)    Examination:  General exam: Appears calm and comfortable  Respiratory system: Clear to auscultation. Respiratory effort normal. Cardiovascular system: S1 & S2 heard, RRR. No JVD, murmurs, rubs, gallops or clicks. No pedal edema. Gastrointestinal system: Abdomen is nondistended, soft and nontender. No organomegaly or masses felt. Normal bowel sounds heard. Central nervous system: Alert and oriented. No focal neurological deficits. Extremities: Symmetric 5 x 5 power. Skin: No rashes, lesions or ulcers Psychiatry: Judgement and insight appear normal. Mood & affect appropriate.     Data Reviewed: I have personally reviewed following labs and imaging studies  CBC: Recent Labs  Lab 07/17/17 1302 07/18/17 0739  07/18/17 1658  WBC 6.3 4.8  --   NEUTROABS 3.8  --   --   HGB 8.5* 7.4* 6.9*  HCT 28.7* 25.7* 23.8*  MCV 75.9* 76.5*  --   PLT 508* 387  --    Basic Metabolic Panel: Recent Labs  Lab 07/17/17 1302 07/18/17 0739  NA 137 144  K 3.5 3.5  CL 103 113*  CO2 22 22  GLUCOSE 90 85  BUN 8 6  CREATININE 0.86 0.74  CALCIUM 9.5 8.5*  MG  --  1.8  PHOS  --  3.3    GFR: Estimated Creatinine Clearance: 83.2 mL/min (by C-G formula based on SCr of 0.74 mg/dL). Liver Function Tests: Recent Labs  Lab 07/17/17 1302  AST 21  ALT 12*  ALKPHOS 71  BILITOT 0.3  PROT 7.8  ALBUMIN 4.7   Recent Labs  Lab 07/17/17 1302  LIPASE 18   No results for input(s): AMMONIA in the last 168 hours. Coagulation Profile: No results for input(s): INR, PROTIME in the last 168 hours. Cardiac Enzymes: No results for input(s): CKTOTAL, CKMB, CKMBINDEX, TROPONINI in the last 168 hours. BNP (last 3 results) No results for input(s): PROBNP in the last 8760 hours. HbA1C: No results for input(s): HGBA1C in the last 72 hours. CBG: No results for input(s): GLUCAP in the last 168 hours. Lipid Profile: No results for input(s): CHOL, HDL, LDLCALC, TRIG, CHOLHDL, LDLDIRECT in the last 72 hours. Thyroid Function Tests: Recent Labs    07/17/17 1302  TSH 0.993   Anemia Panel: Recent Labs    07/18/17 1026  RETICCTPCT 0.6   Sepsis Labs: No results for input(s): PROCALCITON, LATICACIDVEN in the last 168 hours.  Recent Results (from the past 240 hour(s))  Wet prep, genital     Status: Abnormal   Collection Time: 07/17/17  2:58 PM  Result Value Ref Range Status   Yeast Wet Prep HPF POC NONE SEEN NONE SEEN Final   Trich, Wet Prep NONE SEEN NONE SEEN Final   Clue Cells Wet Prep HPF POC NONE SEEN NONE SEEN Final   WBC, Wet Prep HPF POC MODERATE (A) NONE SEEN Final   Sperm NONE SEEN  Final    Comment: Performed at [facility], [address], [place], [place] [address]         Radiology Studies: Dg Chest 2 View  Result Date: 07/17/2017 CLINICAL DATA:  Unexplained weight loss.  Vertigo. EXAM: CHEST  2 VIEW COMPARISON:  02/08/2008 FINDINGS: The heart size and mediastinal contours are within normal limits. Both lungs are clear. The visualized skeletal structures are unremarkable. IMPRESSION: No active cardiopulmonary disease. Electronically Signed   By: Earle Gell  M.D.   On: 07/17/2017 14:57   Ct Head Wo Contrast  Result Date: 07/17/2017 CLINICAL DATA:  Ataxia, slowly progressive or long duration. 3 falls during the last 3 weeks. EXAM: CT HEAD WITHOUT CONTRAST TECHNIQUE: Contiguous axial images were obtained from the base of the skull through the vertex without intravenous contrast. COMPARISON:  CT head without contrast 10/05/2015 FINDINGS: Brain: No acute infarct, hemorrhage, or mass lesion is present. The ventricles are of normal size. No significant extraaxial fluid collection is present. No significant white matter disease is present. The brainstem and cerebellum are normal. Vascular: No hyperdense vessel or unexpected calcification. Skull: Calvarium is intact. No focal lytic or blastic lesions are present. Sinuses/Orbits: The paranasal sinuses the mastoid air cells are clear. Frontal sinuses are not aerated. The inner ear structures are normally formed.  The superior and lateral semicircular canals appear to be covered. The globes and orbits are within normal limits. IMPRESSION: Negative CT of the head. Electronically Signed   By: San Morelle M.D.   On: 07/17/2017 15:13        Scheduled Meds: . acetaminophen  650 mg Oral Once  . buprenorphine-naloxone  1 tablet Sublingual Daily  . buPROPion  150 mg Oral Daily  . cyclobenzaprine  10 mg Oral Once  . dexamethasone  10 mg Oral Once  . diphenhydrAMINE  25 mg Oral Once  . diphenhydrAMINE  25 mg Intravenous Once  . feeding supplement (ENSURE ENLIVE)  237 mL Oral BID BM  . ferrous sulfate  325 mg Oral Q breakfast  . FLUoxetine  60 mg Oral Daily  . gabapentin  600 mg Oral QID  . nicotine  14 mg Transdermal Daily  . pantoprazole  40 mg Oral Daily   Continuous Infusions: . sodium chloride    . ferumoxytol       LOS: 0 days    Time spent: 40 mins    Irine Seal, MD [facility] Pager [phone]  If 7PM-7AM, please contact night-coverage [email or web address] Password  [facility] 07/18/2017, 5:58 PM

## 2017-07-18 NOTE — Progress Notes (Signed)
CRITICAL VALUE ALERT  Critical Value:  Hemoglobin 6.9  Date & Time Notied:  07/18/2017 1734  Provider Notified: Dr. Grandville Silos notified via text page.  Orders Received/Actions taken:

## 2017-07-19 DIAGNOSIS — R51 Headache: Secondary | ICD-10-CM

## 2017-07-19 DIAGNOSIS — N939 Abnormal uterine and vaginal bleeding, unspecified: Secondary | ICD-10-CM

## 2017-07-19 DIAGNOSIS — R42 Dizziness and giddiness: Secondary | ICD-10-CM

## 2017-07-19 DIAGNOSIS — N920 Excessive and frequent menstruation with regular cycle: Secondary | ICD-10-CM

## 2017-07-19 DIAGNOSIS — D509 Iron deficiency anemia, unspecified: Secondary | ICD-10-CM

## 2017-07-19 DIAGNOSIS — I951 Orthostatic hypotension: Secondary | ICD-10-CM

## 2017-07-19 DIAGNOSIS — K219 Gastro-esophageal reflux disease without esophagitis: Secondary | ICD-10-CM

## 2017-07-19 DIAGNOSIS — D62 Acute posthemorrhagic anemia: Secondary | ICD-10-CM

## 2017-07-19 DIAGNOSIS — F329 Major depressive disorder, single episode, unspecified: Secondary | ICD-10-CM

## 2017-07-19 LAB — BASIC METABOLIC PANEL
ANION GAP: 7 (ref 5–15)
BUN: 5 mg/dL — AB (ref 6–20)
CHLORIDE: 109 mmol/L (ref 101–111)
CO2: 25 mmol/L (ref 22–32)
Calcium: 8.4 mg/dL — ABNORMAL LOW (ref 8.9–10.3)
Creatinine, Ser: 0.52 mg/dL (ref 0.44–1.00)
Glucose, Bld: 82 mg/dL (ref 65–99)
POTASSIUM: 3.6 mmol/L (ref 3.5–5.1)
SODIUM: 141 mmol/L (ref 135–145)

## 2017-07-19 LAB — CBC
HCT: 30.5 % — ABNORMAL LOW (ref 36.0–46.0)
HEMOGLOBIN: 9.2 g/dL — AB (ref 12.0–15.0)
MCH: 23.7 pg — AB (ref 26.0–34.0)
MCHC: 30.2 g/dL (ref 30.0–36.0)
MCV: 78.6 fL (ref 78.0–100.0)
PLATELETS: 367 10*3/uL (ref 150–400)
RBC: 3.88 MIL/uL (ref 3.87–5.11)
RDW: 18 % — ABNORMAL HIGH (ref 11.5–15.5)
WBC: 4.7 10*3/uL (ref 4.0–10.5)

## 2017-07-19 LAB — GC/CHLAMYDIA PROBE AMP (~~LOC~~) NOT AT ARMC
Chlamydia: NEGATIVE
Neisseria Gonorrhea: NEGATIVE

## 2017-07-19 LAB — MAGNESIUM: Magnesium: 1.8 mg/dL (ref 1.7–2.4)

## 2017-07-19 MED ORDER — IBUPROFEN 600 MG PO TABS
600.0000 mg | ORAL_TABLET | Freq: Four times a day (QID) | ORAL | Status: DC | PRN
  Administered 2017-07-19: 600 mg via ORAL
  Filled 2017-07-19: qty 1

## 2017-07-19 MED ORDER — NICOTINE 14 MG/24HR TD PT24: 14.0000 mg | MEDICATED_PATCH | Freq: Every day | TRANSDERMAL | 0 refills | Status: AC

## 2017-07-19 NOTE — Progress Notes (Signed)
Pt discharged home today per Dr. Grandville Silos. Pt's IV site D/C'd and WDL. Pt's VSS. Pt provided with home medication list, discharge instructions and prescriptions. Verbalized understanding. Pt left floor via WC in stable condition accompanied by RN.

## 2017-07-19 NOTE — Discharge Summary (Signed)
Physician Discharge Summary  Megan Gregory MWN:[id number] DOB: 1976/03/10 DOA: 07/17/2017  PCP: [facility] date: 07/17/2017 Discharge date: 07/19/2017  Time spent: 55 minutes  Recommendations for Outpatient Follow-up:  1. Follow-up with [facility] as scheduled 08/03/2017.  On follow-up patient will need a CBC done to follow-up on H&H.  Patient also need a basic metabolic profile done to follow-up on electrolytes and renal function.  Patient will need to be set up for second dose of IV Feraheme 510 mg x1. 2. Follow-up with OB/GYN for further evaluation of fibroids/menorrhagia/uterine bleeding.   Discharge Diagnoses:  Principal Problem:   Orthostatic hypotension Active Problems:   Vertigo   Vaginal bleeding   Microcytic anemia   Severe headache   Acute blood loss anemia   Menorrhagia with regular cycle   Uterine bleeding   Gastroesophageal reflux disease   Depression   Discharge Condition: Stable and improved  Diet recommendation: Regular  Filed Weights   07/17/17 1256  Weight: 66.7 kg (147 lb)    History of present illness:  Per Dr Farris Has is a 42 y.o. female with medical history significant of anxiety, depression, uterine fibroids. Patient presented with three-week history of complaints of dizziness described as the room spinning. Gradual onset. Symptoms were not associated with any recent URI or other illnesses/symptoms. Symptoms came on while at work. Typically comes on when patient goes from lying or sitting to standing. Improved after patient stands up for several seconds holding onto something. Occasionally symptoms will come on while sitting. Symptoms were not better or worse and on chart. Associated with severe headache and has become constant. Ibuprofen 800 mg 6 times daily with limited improvement.  Endorsed multiple falls secondary to the dizziness and slight weakness of the L hand.    Patient endorsed smoking approximately half pack per day of nicotine between regular cigarettes and E- cigarettes.   Patient with a long-standing history of uterine fibroids and dysmenorrhea. Stated that hemoglobin stayed between 6 and 8. Current episode of bleeding was not unusual for her. Compliant with her home iron.     Hospital Course:  #1 orthostatic hypotension Likely secondary to menorrhagia and dehydration.  Patient was hydrated IV fluid with resolution of orthostatic hypotension and clinical improvement.  Outpatient follow-up.   2.  Vertigo Improved.  Secondary to problem #1.   3.  Acute blood loss anemia/microcytic anemia secondary to uterine bleeding from fibroids Secondary to menorrhagia.  Anemia panel c/w iron deficiency anemia. Hemoglobin trended down with hydration such that hemoglobin came down from 8.5 on admission to 6.9.  Patient did receive a dose of IV Feraheme.  Patient was subsequently transfused 2 units packed red blood cells with hemoglobin at 9.2 by day of discharge.  Patient improved clinically will follow-up with PCP in the outpatient setting.  4.  Menorrhagia/uterine bleeding secondary to history of fibroids Outpatient follow-up with OB/GYN.  5.  Severe headache Likely in part to have a complement of rebound from medication overuse.  CT head normal.  Patient received a dose of Decadron x1, Zofran, Benadryl, Flexeril, ibuprofen, Tylenol with improvement in headache.  Patient was placed on ibuprofen as needed for headaches.  Outpatient follow-up.  6.  Gastroesophageal reflux disease Patient maintained on PPI.  7.  Depression Patient maintained on home regimen of Wellbutrin and Prozac.  8.  Tobacco abuse Tobacco cessation. Patient placed on a nicotine patch.  Outpatient follow-up.   9.  Chronic pain Patient on Subutex prior to admission.  Maintained on home regimen of Subutex and Neurontin.     Procedures:  CT head 07/17/2017  CXR  07/17/2017  Transfused 2 units packed red blood cells 07/18/2017      Consultations:  None  Discharge Exam: Vitals:   07/19/17 0331 07/19/17 0608  BP: 129/65 125/67  Pulse: (!) 59 63  Resp: 18 18  Temp: 98.1 F (36.7 C) 98.8 F (37.1 C)  SpO2: 100% 100%    General: NAD Cardiovascular: RRR Respiratory: CTAB  Discharge Instructions   Discharge Instructions    Diet general   Complete by:  As directed    Increase activity slowly   Complete by:  As directed      Allergies as of 07/19/2017      Reactions   Nsaids Other (See Comments)   Ulcer      Medication List    TAKE these medications   buprenorphine 8 MG Subl SL tablet Commonly known as:  SUBUTEX Place 8 mg under the tongue daily.   buPROPion 150 MG 24 hr tablet Commonly known as:  WELLBUTRIN XL Take 150 mg by mouth daily.   Dexlansoprazole 30 MG capsule Take 30 mg by mouth daily.   ferrous sulfate 325 (65 FE) MG tablet Take 325 mg by mouth daily with breakfast.   FLUoxetine 20 MG capsule Commonly known as:  PROZAC Take 60 mg by mouth daily.   gabapentin 600 MG tablet Commonly known as:  NEURONTIN Take 600 mg by mouth 4 (four) times daily.   nicotine 14 mg/24hr patch Commonly known as:  NICODERM CQ - dosed in mg/24 hours Place 1 patch (14 mg total) onto the skin daily. Start taking on:  07/20/2017      Allergies  Allergen Reactions  . Nsaids Other (See Comments)    Ulcer   Follow-up Information    [facility] Schedule an appointment as soon as possible for a visit on 08/03/2017.   Why:  10:15 Contact information: [address] [place] [address] [phone]        Schedule an appointment as soon as possible for a visit with obgyn.   Why:  f/u with OBGYN           The results of significant diagnostics from this hospitalization (including imaging, microbiology, ancillary and laboratory) are listed below for reference.    Significant Diagnostic  Studies: Dg Chest 2 View  Result Date: 07/17/2017 CLINICAL DATA:  Unexplained weight loss.  Vertigo. EXAM: CHEST  2 VIEW COMPARISON:  02/08/2008 FINDINGS: The heart size and mediastinal contours are within normal limits. Both lungs are clear. The visualized skeletal structures are unremarkable. IMPRESSION: No active cardiopulmonary disease. Electronically Signed   By: Earle Gell M.D.   On: 07/17/2017 14:57   Ct Head Wo Contrast  Result Date: 07/17/2017 CLINICAL DATA:  Ataxia, slowly progressive or long duration. 3 falls during the last 3 weeks. EXAM: CT HEAD WITHOUT CONTRAST TECHNIQUE: Contiguous axial images were obtained from the base of the skull through the vertex without intravenous contrast. COMPARISON:  CT head without contrast 10/05/2015 FINDINGS: Brain: No acute infarct, hemorrhage, or mass lesion is present. The ventricles are of normal size. No significant extraaxial fluid collection is present. No significant white matter disease is present. The brainstem and cerebellum are normal. Vascular: No hyperdense vessel or unexpected calcification. Skull: Calvarium is intact. No focal lytic or blastic lesions are present. Sinuses/Orbits: The paranasal sinuses  the mastoid air cells are clear. Frontal sinuses are not aerated. The inner ear structures are normally formed. The superior and lateral semicircular canals appear to be covered. The globes and orbits are within normal limits. IMPRESSION: Negative CT of the head. Electronically Signed   By: San Morelle M.D.   On: 07/17/2017 15:13    Microbiology: Recent Results (from the past 240 hour(s))  Wet prep, genital     Status: Abnormal   Collection Time: 07/17/17  2:58 PM  Result Value Ref Range Status   Yeast Wet Prep HPF POC NONE SEEN NONE SEEN Final   Trich, Wet Prep NONE SEEN NONE SEEN Final   Clue Cells Wet Prep HPF POC NONE SEEN NONE SEEN Final   WBC, Wet Prep HPF POC MODERATE (A) NONE SEEN Final   Sperm NONE SEEN  Final     Comment: Performed at [facility], [address] [place], [place] [address]     Labs: Basic Metabolic Panel: Recent Labs  Lab 07/17/17 1302 07/18/17 0739 07/19/17 0837  NA 137 144 141  K 3.5 3.5 3.6  CL 103 113* 109  CO2 22 22 25   GLUCOSE 90 85 82  BUN 8 6 5*  CREATININE 0.86 0.74 0.52  CALCIUM 9.5 8.5* 8.4*  MG  --  1.8 1.8  PHOS  --  3.3  --    Liver Function Tests: Recent Labs  Lab 07/17/17 1302  AST 21  ALT 12*  ALKPHOS 71  BILITOT 0.3  PROT 7.8  ALBUMIN 4.7   Recent Labs  Lab 07/17/17 1302  LIPASE 18   No results for input(s): AMMONIA in the last 168 hours. CBC: Recent Labs  Lab 07/17/17 1302 07/18/17 0739 07/18/17 1658 07/19/17 0837  WBC 6.3 4.8  --  4.7  NEUTROABS 3.8  --   --   --   HGB 8.5* 7.4* 6.9* 9.2*  HCT 28.7* 25.7* 23.8* 30.5*  MCV 75.9* 76.5*  --  78.6  PLT 508* 387  --  367   Cardiac Enzymes: No results for input(s): CKTOTAL, CKMB, CKMBINDEX, TROPONINI in the last 168 hours. BNP: BNP (last 3 results) No results for input(s): BNP in the last 8760 hours.  ProBNP (last 3 results) No results for input(s): PROBNP in the last 8760 hours.  CBG: No results for input(s): GLUCAP in the last 168 hours.     Signed:  Irine Seal MD.  [facility] 07/19/2017, 5:45 PM

## 2017-07-19 NOTE — Progress Notes (Signed)
Patient's second unit of PRBC's finished at 0615, all morning labs will be drawn at 0815.

## 2017-07-20 LAB — TYPE AND SCREEN
ABO/RH(D): A POS
Antibody Screen: NEGATIVE
UNIT DIVISION: 0
Unit division: 0

## 2017-07-20 LAB — BPAM RBC
BLOOD PRODUCT EXPIRATION DATE: 201903052359
Blood Product Expiration Date: 201903092359
ISSUE DATE / TIME: 201902242358
ISSUE DATE / TIME: 201902250306
UNIT TYPE AND RH: 6200
Unit Type and Rh: 6200

## 2018-04-16 ENCOUNTER — Emergency Department (HOSPITAL_COMMUNITY)
Admission: EM | Admit: 2018-04-16 | Discharge: 2018-04-17 | Disposition: A | Discharge status: Home / Self Care | Payer: Medicaid - Out of State | Attending: Emergency Medicine | Admitting: Emergency Medicine

## 2018-04-16 ENCOUNTER — Other Ambulatory Visit: Payer: Self-pay

## 2018-04-16 ENCOUNTER — Encounter (HOSPITAL_COMMUNITY): Payer: Self-pay | Admitting: *Deleted

## 2018-04-16 DIAGNOSIS — W108XXA Fall (on) (from) other stairs and steps, initial encounter: Secondary | ICD-10-CM

## 2018-04-16 DIAGNOSIS — M419 Scoliosis, unspecified: Secondary | ICD-10-CM | POA: Insufficient documentation

## 2018-04-16 DIAGNOSIS — F17210 Nicotine dependence, cigarettes, uncomplicated: Secondary | ICD-10-CM | POA: Insufficient documentation

## 2018-04-16 DIAGNOSIS — Z79899 Other long term (current) drug therapy: Secondary | ICD-10-CM | POA: Insufficient documentation

## 2018-04-16 DIAGNOSIS — R079 Chest pain, unspecified: Secondary | ICD-10-CM | POA: Insufficient documentation

## 2018-04-16 DIAGNOSIS — W19XXXA Unspecified fall, initial encounter: Secondary | ICD-10-CM

## 2018-04-16 DIAGNOSIS — Y92009 Unspecified place in unspecified non-institutional (private) residence as the place of occurrence of the external cause: Secondary | ICD-10-CM

## 2018-04-16 DIAGNOSIS — G8929 Other chronic pain: Secondary | ICD-10-CM | POA: Diagnosis not present

## 2018-04-16 DIAGNOSIS — R0789 Other chest pain: Secondary | ICD-10-CM | POA: Diagnosis present

## 2018-04-16 DIAGNOSIS — M25551 Pain in right hip: Secondary | ICD-10-CM | POA: Diagnosis not present

## 2018-04-16 DIAGNOSIS — M5441 Lumbago with sciatica, right side: Secondary | ICD-10-CM | POA: Diagnosis not present

## 2018-04-16 DIAGNOSIS — F419 Anxiety disorder, unspecified: Secondary | ICD-10-CM | POA: Insufficient documentation

## 2018-04-16 DIAGNOSIS — F329 Major depressive disorder, single episode, unspecified: Secondary | ICD-10-CM | POA: Insufficient documentation

## 2018-04-16 HISTORY — DX: Other intervertebral disc degeneration, lumbar region without mention of lumbar back pain or lower extremity pain: M51.369

## 2018-04-16 HISTORY — DX: Scoliosis, unspecified: M41.9

## 2018-04-16 HISTORY — DX: Enthesopathy, unspecified: M77.9

## 2018-04-16 HISTORY — DX: Other intervertebral disc degeneration, lumbar region: M51.36

## 2018-04-16 MED ORDER — CYCLOBENZAPRINE HCL 10 MG PO TABS
10.0000 mg | ORAL_TABLET | Freq: Once | ORAL | Status: AC
  Administered 2018-04-17: 10 mg via ORAL
  Filled 2018-04-16: qty 1

## 2018-04-16 NOTE — ED Triage Notes (Signed)
Pt states that she fell 4 days ago going down the steps to her laundry room, c/o pain to right side of body, lower back pain,

## 2018-04-17 ENCOUNTER — Emergency Department (HOSPITAL_COMMUNITY): Payer: Medicaid - Out of State

## 2018-04-17 MED ORDER — CYCLOBENZAPRINE HCL 5 MG PO TABS: 5.0000 mg | ORAL_TABLET | Freq: Three times a day (TID) | ORAL | 0 refills | Status: AC | PRN

## 2018-04-17 NOTE — ED Provider Notes (Signed)
[facility] EMERGENCY DEPARTMENT Provider Note   CSN: [id number] Arrival date & time: 04/16/18  2308  Time seen 23:37 PM  History   Chief Complaint Chief Complaint  Patient presents with  . Fall    HPI Megan Gregory is a 42 y.o. female.  HPI patient states November 19 she was going down to the basement and she fell at the top of the stairs which husband states has 13 steps.  They are carpeted.  She states her right leg gave out on her which it has been doing lately.  Husband states she only fell halfway down.  She denies hitting her head or having loss of consciousness.  She states 1 of the steps caught her underneath her right arm on her chest.  She does have some right rib pain but she denies shortness of breath or having pleuritic pain.  She states however her worst pain is her right hip.  She states she already knows she needs to have a total hip replacement and is followed by Dr. Jonni Sanger at [facility] in [place].  She states when she coughs it makes her right hip hurt more.  She also complains of pain from her mid back down her back and into her right thigh that is shooting.  She states she has had this before.  She states she has been diagnosed with scoliosis, degenerative disc disease, bulging disc, and bone spurs.  Her orthopedist at [facility] has recommended she have a rod placed in her back which she does not want to do.  She states she has tried back injections at [facility] and by her [facility] orthopedic doctors without improvement.  Patient was employed until about a month ago when she states her orthopedist advised her to quit working because it was making her back pain and hip pain worse.  When asked about her Subutex she states she is not on it anymore and that she was put on it to help her stop taking alprazolam.  She states she has been off the alprazolam for at least 6 months.  PCP [facility] denied by  patient  Past Medical History:  Diagnosis Date  . Anxiety   . Arthritis   . Bone spur    back area  . DDD (degenerative disc disease), lumbar   . DDD (degenerative disc disease), lumbar   . Depressed   . Lupus ([place])   . Scoliosis   . Uterine fibroid     Patient Active Problem List   Diagnosis Date Noted  . Acute blood loss anemia   . Menorrhagia with regular cycle   . Uterine bleeding   . Gastroesophageal reflux disease   . Depression   . Vertigo 07/17/2017  . Microcytic anemia 07/17/2017  . Severe headache 07/17/2017  . Anemia   . Ataxia   . Vaginal bleeding   . Orthostatic hypotension     Past Surgical History:  Procedure Laterality Date  . CESAREAN SECTION    . HERNIA REPAIR    . INSERTION OF MESH       OB History    Gravida  3   Para  2   Term  2   Preterm      AB  1   Living  2     SAB  1   TAB      Ectopic      Multiple      Live Births  Home Medications    Prior to Admission medications   Medication Sig Start Date End Date Taking? Authorizing Provider  buPROPion (WELLBUTRIN XL) 150 MG 24 hr tablet Take 150 mg by mouth daily.   Yes [provider]  Dexlansoprazole 30 MG capsule Take 30 mg by mouth daily.   Yes [provider]  ferrous sulfate 325 (65 FE) MG tablet Take 325 mg by mouth daily with breakfast.   Yes [provider]  FLUoxetine (PROZAC) 20 MG capsule Take 20 mg by mouth daily.    Yes [provider]  gabapentin (NEURONTIN) 600 MG tablet Take 600 mg by mouth 4 (four) times daily.   Yes [provider]  buprenorphine (SUBUTEX) 8 MG SUBL SL tablet Place 8 mg under the tongue daily.    [provider]  cyclobenzaprine (FLEXERIL) 5 MG tablet Take 1 tablet (5 mg total) by mouth 3 (three) times daily as needed. 04/17/18   Rolland Porter, MD  nicotine (NICODERM CQ - DOSED IN MG/24 HOURS) 14 mg/24hr patch Place 1 patch (14 mg total) onto the skin daily. 07/20/17    Eugenie Filler, MD    Family History Family History  Problem Relation Age of Onset  . Cancer Other   . Hypertension Other   . Stroke Other   . Diabetes Other   . Lupus Mother     Social History Social History   Tobacco Use  . Smoking status: Current Every Day Smoker    Packs/day: 1.00    Years: 10.00    Pack years: 10.00    Types: Cigarettes  . Smokeless tobacco: Never Used  Substance Use Topics  . Alcohol use: No  . Drug use: No  states recently quit working   Allergies   Nsaids   Review of Systems Review of Systems  All other systems reviewed and are negative.    Physical Exam Updated Vital Signs BP (!) 146/89 (BP Location: Left Arm)   Pulse (!) 56   Temp 98.3 F (36.8 C) (Oral)   Resp 18   Ht 5\' 1"  (1.549 m)   Wt 64 kg   LMP 04/10/2018   SpO2 98%   BMI 26.64 kg/m  Vital signs normal except bradycardia    Physical Exam  Constitutional: She is oriented to person, place, and time. She appears well-developed and well-nourished.  Non-toxic appearance. She does not appear ill. No distress.  HENT:  Head: Normocephalic and atraumatic.  Right Ear: External ear normal.  Left Ear: External ear normal.  Nose: Nose normal. No mucosal edema or rhinorrhea.  Mouth/Throat: Oropharynx is clear and moist and mucous membranes are normal. No dental abscesses or uvula swelling.  Eyes: Pupils are equal, round, and reactive to light. Conjunctivae and EOM are normal.  Neck: Normal range of motion and full passive range of motion without pain. Neck supple.  Cardiovascular: Normal rate, regular rhythm and normal heart sounds. Exam reveals no gallop and no friction rub.  No murmur heard. Pulmonary/Chest: Effort normal and breath sounds normal. No respiratory distress. She has no wheezes. She has no rhonchi. She has no rales. She exhibits no tenderness and no crepitus.  There is no localized chest discomfort, there is no crepitance, there is no bruising seen.    Abdominal: Soft. Normal appearance and bowel sounds are normal. She exhibits no distension. There is no tenderness. There is no rebound and no guarding.  Musculoskeletal: Normal range of motion. She exhibits tenderness. She exhibits no edema.  Moves all extremities well.  Patient is tender diffusely in her thoracic spine which she states she had before the fall and it is not worse.  She has diffuse tenderness of her lumbar spine that gets worse as you get lower, she also is tender diffusely over the SI joints bilaterally and in her buttock area bilaterally.  When I examine her right hip she is tender on the lateral aspect over the greater trochanter but not over the true hip joint.  She moves her hip well.  I do not see any bruising of her back or on her legs.  Neurological: She is alert and oriented to person, place, and time. She has normal strength. No cranial nerve deficit.  Skin: Skin is warm, dry and intact. No rash noted. No erythema. No pallor.  Psychiatric: She has a normal mood and affect. Her speech is normal and behavior is normal. Her mood appears not anxious.  Nursing note and vitals reviewed.    ED Treatments / Results  Labs (all labs ordered are listed, but only abnormal results are displayed) Labs Reviewed - No data to display  EKG None  Radiology Dg Ribs Unilateral W/chest Right  Result Date: 04/17/2018 CLINICAL DATA:  Patient fell 4 days ago. Low back and right side pain. EXAM: RIGHT RIBS AND CHEST - 3+ VIEW COMPARISON:  Chest 07/17/2017 FINDINGS: Normal heart size and pulmonary vascularity. No focal airspace disease or consolidation in the lungs. No blunting of costophrenic angles. No pneumothorax. Mediastinal contours appear intact. Right ribs appear intact. No acute displaced fractures identified. No focal bone lesion or bone destruction. Soft tissues are unremarkable. IMPRESSION: 1. No evidence of active pulmonary disease. Negative right ribs. Electronically Signed    By: Lucienne Capers M.D.   On: 04/17/2018 00:45   Dg Lumbar Spine Complete  Result Date: 04/17/2018 CLINICAL DATA:  Patient fell 4 days ago. Low back pain since. EXAM: LUMBAR SPINE - COMPLETE 4+ VIEW COMPARISON:  06/24/2014 FINDINGS: Mild lumbar scoliosis convex towards the left. No anterior subluxation of lumbar vertebrae. No vertebral compression deformities. No focal bone lesion or bone destruction. Bone cortex appears intact. Visualized sacrum appears intact. Vascular calcifications in the aorta. IMPRESSION: Mild lumbar scoliosis convex towards the left. No acute displaced fractures identified. Electronically Signed   By: Lucienne Capers M.D.   On: 04/17/2018 00:43   Dg Hip Unilat W Or Wo Pelvis 2-3 Views Right  Result Date: 04/17/2018 CLINICAL DATA:  Patient fell 4 days ago. Right side and low back pain. EXAM: DG HIP (WITH OR WITHOUT PELVIS) 2-3V RIGHT COMPARISON:  None. FINDINGS: Pelvis and right hip appear intact. No evidence of acute fracture or dislocation. No focal bone lesion or bone destruction. SI joints and symphysis pubis are not displaced. Visualized sacrum appears intact. Soft tissues are unremarkable. IMPRESSION: Negative. Electronically Signed   By: Lucienne Capers M.D.   On: 04/17/2018 00:46    Procedures Procedures (including critical care time)  Medications Ordered in ED Medications  cyclobenzaprine (FLEXERIL) tablet 10 mg (10 mg Oral Given 04/17/18 0007)     Initial Impression / Assessment and Plan / ED Course  I have reviewed the triage vital signs and the nursing notes.  Pertinent labs & imaging results that were available during my care of the patient were reviewed by me and considered in my medical decision making (see chart for details).      X-rays were obtained of the areas that she states are worse since she fell.  Patient's x-rays do not show any acute changes.  She was discharged home with a muscle relaxer.    Patient denies being in pain  management however when I look at her prior visits she has an office visit on October 18 with pain management at the [facility].  She told me she had only had an spinal injections 2 times and they did not work.  During this visit they scheduled her for a right-sided interlaminal L4/5 epidural steroid injection.  They describe the same back pain she described to me.  Interestingly although she states she is allergic to nonsteroidals in their chart they document that she is taking Motrin 800 mg several times a day.  Looking at the [facility] and [place] patient has been on gabapentin since July on a regular basis, she also was on it a lot in the early part of 2018 and end of 2017 for 8 months.  Starting in July 2018 she was put on buprenorphine 8 mg sublingual tablets on a regular basis through August 2019.  She has been getting hydrocodone 21 tablets in October 2, September 25, September 16.  I do not see any prescriptions for alprazolam since November 2017.  Final Clinical Impressions(s) / ED Diagnoses   Final diagnoses:  Fall in home, initial encounter  Fall down stairs, initial encounter  Chronic bilateral low back pain with right-sided sciatica  Chronic pain of right hip  Right-sided chest pain    ED Discharge Orders         Ordered    cyclobenzaprine (FLEXERIL) 5 MG tablet  3 times daily PRN     04/17/18 0105          Plan discharge  Rolland Porter, MD, Barbette Or, MD 04/17/18 [phone]

## 2018-04-17 NOTE — Discharge Instructions (Addendum)
Use ice and heat for comfort. Take the flexeril as prescribed. Keep your appointments at Pain Management at the [facility] or your other doctors.

## 2020-02-22 ENCOUNTER — Other Ambulatory Visit: Payer: Self-pay

## 2020-02-22 DIAGNOSIS — L03116 Cellulitis of left lower limb: Secondary | ICD-10-CM | POA: Diagnosis not present

## 2020-02-22 DIAGNOSIS — Z79899 Other long term (current) drug therapy: Secondary | ICD-10-CM | POA: Diagnosis not present

## 2020-02-22 DIAGNOSIS — L539 Erythematous condition, unspecified: Secondary | ICD-10-CM | POA: Diagnosis present

## 2020-02-22 DIAGNOSIS — F17210 Nicotine dependence, cigarettes, uncomplicated: Secondary | ICD-10-CM | POA: Insufficient documentation

## 2020-02-23 ENCOUNTER — Other Ambulatory Visit: Payer: Self-pay

## 2020-02-23 ENCOUNTER — Emergency Department (HOSPITAL_COMMUNITY)
Admission: EM | Admit: 2020-02-23 | Discharge: 2020-02-23 | Disposition: A | Discharge status: Home / Self Care | Payer: Medicaid - Out of State | Attending: Emergency Medicine | Admitting: Emergency Medicine

## 2020-02-23 ENCOUNTER — Encounter (HOSPITAL_COMMUNITY): Payer: Self-pay | Admitting: *Deleted

## 2020-02-23 DIAGNOSIS — L03116 Cellulitis of left lower limb: Secondary | ICD-10-CM

## 2020-02-23 MED ORDER — CEPHALEXIN 500 MG PO CAPS
1000.0000 mg | ORAL_CAPSULE | Freq: Once | ORAL | Status: AC
  Administered 2020-02-23: 1000 mg via ORAL
  Filled 2020-02-23: qty 2

## 2020-02-23 MED ORDER — CEPHALEXIN 500 MG PO CAPS: 500.0000 mg | ORAL_CAPSULE | Freq: Four times a day (QID) | ORAL | 0 refills | Status: AC

## 2020-02-23 NOTE — ED Provider Notes (Signed)
[facility] EMERGENCY DEPARTMENT Provider Note   CSN: [id number] Arrival date & time: 02/22/20  2353     History Chief Complaint  Patient presents with  . Abscess    Megan Gregory is a 44 y.o. female.  HPI Patient states that she had a blood blister on the top of her left foot a few days ago.  She is unsure where it came from.  No IV drug use or obvious trauma.  She thinks it came from her shoes.  However over the next couple days she started having worsening redness around it now spreading up her leg.  She has no nausea, fever, vomiting, chills, malaise or other associated symptoms.  No significant past medical problems.  Has not tried thing for symptoms.  The blister has been draining clear fluid.    Past Medical History:  Diagnosis Date  . Anxiety   . Arthritis   . Bone spur    back area  . DDD (degenerative disc disease), lumbar   . DDD (degenerative disc disease), lumbar   . Depressed   . Lupus ([place])   . Scoliosis   . Uterine fibroid     Patient Active Problem List   Diagnosis Date Noted  . Acute blood loss anemia   . Menorrhagia with regular cycle   . Uterine bleeding   . Gastroesophageal reflux disease   . Depression   . Vertigo 07/17/2017  . Microcytic anemia 07/17/2017  . Severe headache 07/17/2017  . Anemia   . Ataxia   . Vaginal bleeding   . Orthostatic hypotension     Past Surgical History:  Procedure Laterality Date  . ABDOMINAL HYSTERECTOMY    . CESAREAN SECTION    . HERNIA REPAIR    . INSERTION OF MESH    . LUMBAR FUSION       OB History    Gravida  3   Para  2   Term  2   Preterm      AB  1   Living  2     SAB  1   TAB      Ectopic      Multiple      Live Births              Family History  Problem Relation Age of Onset  . Cancer Other   . Hypertension Other   . Stroke Other   . Diabetes Other   . Lupus Mother     Social History   Tobacco Use  . Smoking status: Current Every Day Smoker     Packs/day: 0.50    Years: 10.00    Pack years: 5.00    Types: Cigarettes  . Smokeless tobacco: Never Used  Substance Use Topics  . Alcohol use: No  . Drug use: No    Home Medications Prior to Admission medications   Medication Sig Start Date End Date Taking? Authorizing Provider  buprenorphine (SUBUTEX) 8 MG SUBL SL tablet Place 8 mg under the tongue daily.    [provider]  buPROPion (WELLBUTRIN XL) 150 MG 24 hr tablet Take 150 mg by mouth daily.    [provider]  cephALEXin (KEFLEX) 500 MG capsule Take 1 capsule (500 mg total) by mouth 4 (four) times daily. 02/23/20   Kristian Mogg, Corene Cornea, MD  cyclobenzaprine (FLEXERIL) 5 MG tablet Take 1 tablet (5 mg total) by mouth 3 (three) times daily as needed. 04/17/18   Rolland Porter, MD  Dexlansoprazole  30 MG capsule Take 30 mg by mouth daily.    [provider]  ferrous sulfate 325 (65 FE) MG tablet Take 325 mg by mouth daily with breakfast.    [provider]  FLUoxetine (PROZAC) 20 MG capsule Take 20 mg by mouth daily.     [provider]  gabapentin (NEURONTIN) 600 MG tablet Take 600 mg by mouth 4 (four) times daily.    [provider]  nicotine (NICODERM CQ - DOSED IN MG/24 HOURS) 14 mg/24hr patch Place 1 patch (14 mg total) onto the skin daily. 07/20/17   Eugenie Filler, MD    Allergies    Nsaids  Review of Systems   Review of Systems  All other systems reviewed and are negative.   Physical Exam Updated Vital Signs BP 134/76 (BP Location: Right Arm)   Pulse 80   Temp 98 F (36.7 C) (Oral)   Resp 20   Ht 5' (1.524 m)   Wt 59.4 kg   LMP 04/10/2018   SpO2 100%   BMI 25.58 kg/m   Physical Exam Vitals and nursing note reviewed.  Constitutional:      Appearance: She is well-developed.  HENT:     Head: Normocephalic and atraumatic.     Mouth/Throat:     Mouth: Mucous membranes are moist.     Pharynx: Oropharynx is clear.  Eyes:     Pupils: Pupils are equal, round, and  reactive to light.  Cardiovascular:     Rate and Rhythm: Normal rate and regular rhythm.  Pulmonary:     Effort: No respiratory distress.     Breath sounds: No stridor.  Abdominal:     General: Abdomen is flat. There is no distension.  Musculoskeletal:        General: No swelling or tenderness. Normal range of motion.     Cervical back: Normal range of motion.  Skin:    General: Skin is warm and dry.     Findings: Erythema (around dorsal foot, see pictures below) and rash present.  Neurological:     General: No focal deficit present.     Mental Status: She is alert.           ED Results / Procedures / Treatments   Labs (all labs ordered are listed, but only abnormal results are displayed) Labs Reviewed - No data to display  EKG None  Radiology No results found.  Procedures Procedures (including critical care time)  Medications Ordered in ED Medications  cephALEXin (KEFLEX) capsule 1,000 mg (1,000 mg Oral Given 02/23/20 0247)    ED Course  I have reviewed the triage vital signs and the nursing notes.  Pertinent labs & imaging results that were available during my care of the patient were reviewed by me and considered in my medical decision making (see chart for details).    MDM Rules/Calculators/A&P                          Cellulitis from blister. Supportive care described. Antibiotics started. No e/o sepsis or need for further workup/hospitalization at this time.   Final Clinical Impression(s) / ED Diagnoses Final diagnoses:  Cellulitis of left lower extremity    Rx / DC Orders ED Discharge Orders         Ordered    cephALEXin (KEFLEX) 500 MG capsule  4 times daily        02/23/20 0208  Jaymee Tilson, Corene Cornea, MD 02/23/20 [phone]

## 2020-02-23 NOTE — ED Notes (Signed)
Wound dressed, instructions given along w/physician instructions on dc papers

## 2020-02-23 NOTE — Discharge Instructions (Addendum)
Abscesses and cellulitis are often caused by bacteria that live on your skin naturally all the time.  One way to get rid of this is to put a cup of bleach in a bathtub of water soaking for 10 minutes a day for a week.  If you do this, realize the bleach will stain your clothes, towels, carpets, rugs and anything else with die.  It will also make your feet slippery when used about a tub to be careful not to fall.  Another thing that works very well is chlorhexidine washes.  You can buy chlorhexidine wipes at the store and cleanse your whole body with them once a day for 7 days along with putting mupirocin ointment in your nose and finding chlorhexidine mouthwash to rinse her mouth with twice a day.   

## 2020-02-23 NOTE — ED Triage Notes (Signed)
Pt states she noticed a blood blister x 3 days ago to her left foot; pt states she now has swelling and redness to her left foot

## 2022-09-03 ENCOUNTER — Encounter (HOSPITAL_COMMUNITY): Payer: Self-pay

## 2022-09-03 ENCOUNTER — Emergency Department (HOSPITAL_COMMUNITY)
Admission: EM | Admit: 2022-09-03 | Discharge: 2022-09-04 | Disposition: A | Discharge status: Home / Self Care | Payer: Medicaid Other | Attending: Emergency Medicine | Admitting: Emergency Medicine

## 2022-09-03 ENCOUNTER — Other Ambulatory Visit: Payer: Self-pay

## 2022-09-03 DIAGNOSIS — Z1152 Encounter for screening for COVID-19: Secondary | ICD-10-CM | POA: Insufficient documentation

## 2022-09-03 DIAGNOSIS — F1121 Opioid dependence, in remission: Secondary | ICD-10-CM | POA: Insufficient documentation

## 2022-09-03 DIAGNOSIS — Z981 Arthrodesis status: Secondary | ICD-10-CM | POA: Diagnosis not present

## 2022-09-03 DIAGNOSIS — M545 Low back pain, unspecified: Secondary | ICD-10-CM | POA: Insufficient documentation

## 2022-09-03 LAB — BASIC METABOLIC PANEL
Anion gap: 11 (ref 5–15)
BUN: 12 mg/dL (ref 6–20)
CO2: 26 mmol/L (ref 22–32)
Calcium: 9.7 mg/dL (ref 8.9–10.3)
Chloride: 100 mmol/L (ref 98–111)
Creatinine, Ser: 0.56 mg/dL (ref 0.44–1.00)
GFR, Estimated: >60 mL/min (ref >60)
Glucose, Bld: 89 mg/dL (ref 70–99)
Potassium: 4 mmol/L (ref 3.5–5.1)
Sodium: 137 mmol/L (ref 135–145)

## 2022-09-03 LAB — CBC WITH DIFFERENTIAL/PLATELET
Abs Immature Granulocytes: 0.04 10*3/uL (ref 0.00–0.07)
Basophils Absolute: 0.1 10*3/uL (ref 0.0–0.1)
Basophils Relative: 1 %
Eosinophils Absolute: 0.1 10*3/uL (ref 0.0–0.5)
Eosinophils Relative: 0 %
HCT: 41.8 % (ref 36.0–46.0)
Hemoglobin: 13.4 g/dL (ref 12.0–15.0)
Immature Granulocytes: 0 %
Lymphocytes Relative: 12 %
Lymphs Abs: 1.4 10*3/uL (ref 0.7–4.0)
MCH: 28.9 pg (ref 26.0–34.0)
MCHC: 32.1 g/dL (ref 30.0–36.0)
MCV: 90.1 fL (ref 80.0–100.0)
Monocytes Absolute: 0.6 10*3/uL (ref 0.1–1.0)
Monocytes Relative: 5 %
Neutro Abs: 9.7 10*3/uL — ABNORMAL HIGH (ref 1.7–7.7)
Neutrophils Relative %: 82 %
Platelets: 346 10*3/uL (ref 150–400)
RBC: 4.64 MIL/uL (ref 3.87–5.11)
RDW: 15.1 % (ref 11.5–15.5)
WBC: 11.9 10*3/uL — ABNORMAL HIGH (ref 4.0–10.5)
nRBC: 0 % (ref 0.0–0.2)

## 2022-09-03 LAB — RESP PANEL BY RT-PCR (RSV, FLU A&B, COVID)  RVPGX2
Influenza A by PCR: NEGATIVE
Influenza B by PCR: NEGATIVE
Resp Syncytial Virus by PCR: NEGATIVE
SARS Coronavirus 2 by RT PCR: NEGATIVE

## 2022-09-03 LAB — SEDIMENTATION RATE: Sed Rate: 2 mm/hr (ref 0–22)

## 2022-09-03 MED ORDER — ONDANSETRON 4 MG PO TBDP: 4.0000 mg | ORAL_TABLET | Freq: Once | ORAL | Status: DC

## 2022-09-03 MED ORDER — BUPRENORPHINE HCL-NALOXONE HCL 8-2 MG SL SUBL
1.0000 | SUBLINGUAL_TABLET | Freq: Every day | SUBLINGUAL | Status: DC
  Administered 2022-09-03: 1 via SUBLINGUAL
  Filled 2022-09-03: qty 1

## 2022-09-03 MED ORDER — LORAZEPAM 1 MG PO TABS
1.0000 mg | ORAL_TABLET | ORAL | Status: DC
  Filled 2022-09-03: qty 1

## 2022-09-03 MED ORDER — ONDANSETRON HCL 4 MG/2ML IJ SOLN
4.0000 mg | Freq: Once | INTRAMUSCULAR | Status: AC
  Administered 2022-09-03: 4 mg via INTRAVENOUS
  Filled 2022-09-03: qty 2

## 2022-09-03 MED ORDER — ACETAMINOPHEN 500 MG PO TABS
1000.0000 mg | ORAL_TABLET | Freq: Once | ORAL | Status: AC
  Administered 2022-09-03: 1000 mg via ORAL
  Filled 2022-09-03: qty 2

## 2022-09-03 NOTE — ED Notes (Signed)
ED Provider at bedside. 

## 2022-09-03 NOTE — ED Provider Notes (Signed)
[place] EMERGENCY DEPARTMENT AT [facility] Provider Note   CSN: [id number] Arrival date & time: 09/03/22  1734     History  Chief Complaint  Patient presents with   Back Pain    Megan Gregory is a 47 y.o. female.  47 year old female with a history of IVDU in remission, chronic pain, and degenerative disc disease status post L4-S1 spinal fusion who presents to the emergency department with back pain and nausea and vomiting and diarrhea.  Patient had spinal fusion surgery on June 19, 2022.  Says that since then she has been having persistent back pain.  1 week ago ran out of her hydrocodone and since then has been having nausea vomiting and diarrhea.  Denies any IV drug use since her back surgery.  Was supposed to start on Suboxone this week but has not started and thinks her symptoms could be due to that.  Also reports worsening back pain as well as 2 months of bowel incontinence and 30 days of changes in sensation when she wipes.  No urinary incontinence.  Denies leg weakness or numbness otherwise.  Does follow with pain clinic who was supposed to start her on Suboxone.       Home Medications Prior to Admission medications   Medication Sig Start Date End Date Taking? Authorizing Provider  buprenorphine (SUBUTEX) 8 MG SUBL SL tablet Place 8 mg under the tongue daily.    [provider]  buPROPion (WELLBUTRIN XL) 150 MG 24 hr tablet Take 150 mg by mouth daily.    [provider]  cephALEXin (KEFLEX) 500 MG capsule Take 1 capsule (500 mg total) by mouth 4 (four) times daily. 02/23/20   Mesner, Barbara Cower, MD  cyclobenzaprine (FLEXERIL) 5 MG tablet Take 1 tablet (5 mg total) by mouth 3 (three) times daily as needed. 04/17/18   Devoria Albe, MD  Dexlansoprazole 30 MG capsule Take 30 mg by mouth daily.    [provider]  ferrous sulfate 325 (65 FE) MG tablet Take 325 mg by mouth daily with breakfast.    [provider]  FLUoxetine (PROZAC)  20 MG capsule Take 20 mg by mouth daily.     [provider]  gabapentin (NEURONTIN) 600 MG tablet Take 600 mg by mouth 4 (four) times daily.    [provider]  nicotine (NICODERM CQ - DOSED IN MG/24 HOURS) 14 mg/24hr patch Place 1 patch (14 mg total) onto the skin daily. 07/20/17   Rodolph Bong, MD      Allergies    Nsaids    Review of Systems   Review of Systems  Physical Exam Updated Vital Signs BP 139/73   Pulse (!) 55   Temp 97.6 F (36.4 C) (Oral)   Resp 11   Ht 5' (1.524 m)   Wt 59.4 kg   LMP 04/10/2018   SpO2 100%   BMI 25.58 kg/m  Physical Exam Vitals and nursing note reviewed.  Constitutional:      General: She is not in acute distress.    Appearance: She is well-developed.  HENT:     Head: Normocephalic and atraumatic.     Right Ear: External ear normal.     Left Ear: External ear normal.     Nose: Nose normal.  Eyes:     Extraocular Movements: Extraocular movements intact.     Conjunctiva/sclera: Conjunctivae normal.     Pupils: Pupils are equal, round, and reactive to light.  Cardiovascular:  Rate and Rhythm: Normal rate and regular rhythm.     Heart sounds: No murmur heard. Pulmonary:     Effort: Pulmonary effort is normal. No respiratory distress.     Breath sounds: Normal breath sounds.  Abdominal:     General: Abdomen is flat. There is no distension.     Palpations: Abdomen is soft. There is no mass.     Tenderness: There is no abdominal tenderness. There is no guarding.  Musculoskeletal:     Cervical back: Normal range of motion and neck supple.  Skin:    General: Skin is warm and dry.  Neurological:     Mental Status: She is alert and oriented to person, place, and time. Mental status is at baseline.     Comments: No spinal midline TTP in cervical, thoracic, or lumbar spine. No stepoffs noted.   Motor: Muscle bulk and tone are normal. Strength is 5/5 in hip flexion, knee flexion and extension, ankle dorsiflexion and  plantar flexion bilaterally. Full strength of great toe dorsiflexion bilaterally.  Sensory: Intact sensation to light touch in L2 though S1 dermatomes bilaterally.   Psychiatric:        Mood and Affect: Mood normal.     ED Results / Procedures / Treatments   Labs (all labs ordered are listed, but only abnormal results are displayed) Labs Reviewed  CBC WITH DIFFERENTIAL/PLATELET - Abnormal; Notable for the following components:      Result Value   WBC 11.9 (*)    Neutro Abs 9.7 (*)    All other components within normal limits  RESP PANEL BY RT-PCR (RSV, FLU A&B, COVID)  RVPGX2  BASIC METABOLIC PANEL  SEDIMENTATION RATE  C-REACTIVE PROTEIN    EKG None  Radiology No results found.  Procedures Procedures   Medications Ordered in ED Medications  buprenorphine-naloxone (SUBOXONE) 8-2 mg per SL tablet 1 tablet (1 tablet Sublingual Given 09/03/22 2359)  LORazepam (ATIVAN) tablet 1 mg (has no administration in time range)  acetaminophen (TYLENOL) tablet 1,000 mg (1,000 mg Oral Given 09/03/22 2027)  ondansetron (ZOFRAN) injection 4 mg (4 mg Intravenous Given 09/03/22 2027)    ED Course/ Medical Decision Making/ A&P                             Medical Decision Making Amount and/or Complexity of Data Reviewed Labs: ordered. Radiology: ordered.  Risk OTC drugs. Prescription drug management.   Megan Gregory is a 47 y.o. female with comorbidities that complicate the patient evaluation including IVDU in remission, chronic pain, and degenerative disc disease status post L4-S1 spinal fusion who presents to the emergency department with back pain and nausea and vomiting and diarrhea.    Initial Ddx:  Opioid withdrawal, gastroenteritis, URI, spinal cord compression, spinal epidural abscess  MDM:  Suspect that patient's GI symptoms are likely due to opioid withdrawal.  Will obtain COWS score at this time and give Suboxone since it has been over a week since her last opiate  use.  Also obtain a COVID and flu.  Her bowel incontinence and changes in sensation when wiping are somewhat concerning though her neurologic exam is otherwise intact.  Do feel that she needs MRI at this time.  Will send inflammatory markers to see if she will need MRI with and without contrast of her entire spine or if she can just have a MRI of the lumbar spine to assess for postoperative complications or spinal cord  compression from her degenerative disc disease.  Plan:  Labs COVID and flu ESR and CRP MRI Tylenol Zofran Suboxone  ED Summary/Re-evaluation:  Patient underwent the above workup.  White blood cell count was found to be elevated so she had a MRI of the spine with and without contrast ordered to evaluate for spinal epidural abscess.  COVID and flu were negative.  Do suspect that her symptoms are due to mild opiate withdrawal.  Patient discussed with Dr. Manus Gunningancour who accepted [facility] department for ED to ED transfer for her MRI.  This patient presents to the ED for concern of complaints listed in HPI, this involves an extensive number of treatment options, and is a complaint that carries with it a high risk of complications and morbidity. Disposition including potential need for admission considered.   Dispo: Pending remainder of workup  Additional history obtained from spouse Records reviewed Outpatient Clinic Notes The following labs were independently interpreted: Chemistry and show no acute abnormality I personally reviewed and interpreted cardiac monitoring: normal sinus rhythm  I personally reviewed and interpreted the pt's EKG: see above for interpretation  I have reviewed the patients home medications and made adjustments as needed Social Determinants of health:  Hx of IVDU  Final Clinical Impression(s) / ED Diagnoses Final diagnoses:  Acute midline low back pain, unspecified whether sciatica present  History of lumbar spinal fusion  Opioid  dependence in remission    Rx / DC Orders ED Discharge Orders     None         Rondel BatonPaterson, Clarinda Obi C, MD 09/04/22 0008

## 2022-09-03 NOTE — ED Triage Notes (Signed)
Pt bib [facility] with c/o back pain, had back surgery end of last year. Has not been taking oxy in a week, was prescribed suboxone which has not been taking, has been experiencing, nausea, vomiting, chills for 3 days.

## 2022-09-04 ENCOUNTER — Emergency Department (HOSPITAL_COMMUNITY): Payer: Medicaid Other

## 2022-09-04 DIAGNOSIS — Z981 Arthrodesis status: Secondary | ICD-10-CM | POA: Diagnosis not present

## 2022-09-04 DIAGNOSIS — M545 Low back pain, unspecified: Secondary | ICD-10-CM | POA: Diagnosis present

## 2022-09-04 DIAGNOSIS — F1121 Opioid dependence, in remission: Secondary | ICD-10-CM | POA: Diagnosis not present

## 2022-09-04 DIAGNOSIS — Z1152 Encounter for screening for COVID-19: Secondary | ICD-10-CM | POA: Diagnosis not present

## 2022-09-04 LAB — C-REACTIVE PROTEIN: CRP: <0.5 mg/dL (ref <1.0)

## 2022-09-04 MED ORDER — LORAZEPAM 1 MG PO TABS
2.0000 mg | ORAL_TABLET | Freq: Once | ORAL | Status: AC
  Administered 2022-09-04: 2 mg via ORAL
  Filled 2022-09-04: qty 2

## 2022-09-04 MED ORDER — ACETAMINOPHEN 500 MG PO TABS
1000.0000 mg | ORAL_TABLET | ORAL | Status: AC
  Administered 2022-09-04: 1000 mg via ORAL
  Filled 2022-09-04: qty 2

## 2022-09-04 MED ORDER — MIDAZOLAM HCL 2 MG/2ML IJ SOLN: 2.0000 mg | Freq: Once | INTRAMUSCULAR | Status: DC | PRN

## 2022-09-04 MED ORDER — MORPHINE SULFATE (PF) 4 MG/ML IV SOLN
4.0000 mg | Freq: Once | INTRAVENOUS | Status: AC
  Administered 2022-09-04: 4 mg via INTRAVENOUS
  Filled 2022-09-04: qty 1

## 2022-09-04 MED ORDER — KETOROLAC TROMETHAMINE 15 MG/ML IJ SOLN
15.0000 mg | Freq: Once | INTRAMUSCULAR | Status: AC
  Administered 2022-09-04: 15 mg via INTRAVENOUS
  Filled 2022-09-04: qty 1

## 2022-09-04 NOTE — ED Notes (Signed)
Up date Family [phone] Megan Gregory

## 2022-09-04 NOTE — ED Provider Notes (Cosign Needed)
Accepted handoff at shift change from [facility], PA-C. Please see prior provider note for more detail.   Briefly: Patient is 47 y.o. with history of IVDU in remission, chronic pain, and degenerative disc disease status post L4-S1 spinal fusion presenting for back pain, nausea vomiting diarrhea.  Also in setting of 2 months of bowel incontinence.  Was transferred here from [facility] for evaluation for MRI to evaluate for spinal epidural abscess.  DDX: concern for Opioid withdrawal, gastroenteritis, URI, spinal cord compression, spinal epidural abscess   Plan: Undergo MRI and follow-up on results.   Physical Exam  BP (!) 129/53 (BP Location: Right Arm)   Pulse (!) 58   Temp 98.3 F (36.8 C) (Oral)   Resp 20   Ht 5' (1.524 m)   Wt 59.4 kg   LMP 04/10/2018   SpO2 100%   BMI 25.58 kg/m   Physical Exam  Procedures  Procedures  ED Course / MDM    Medical Decision Making Amount and/or Complexity of Data Reviewed Labs: ordered. Radiology: ordered.  Risk OTC drugs. Prescription drug management.   Overall, patient appears clinically well.  Resting comfortably in bed and moving all extremities appropriately.  Attempted to undergo MRI now 4 times.  Each time was interrupted by patient refusing to complete the test.  Stating she did not want to test and wants to go home.  Now expressing strong desire to go home.  Given how clinically well she appears, suspicion for spinal epidural abscess is low.  Insistent that she does not need the scan and is now agreeable to leaving AGAINST MEDICAL ADVICE.       Gareth Eagle, PA-C 09/04/22 [phone]

## 2022-09-04 NOTE — ED Notes (Signed)
[facility] present to transport pt 

## 2022-09-04 NOTE — ED Notes (Signed)
Patient was asleep, advised she was going for MRI.

## 2022-09-04 NOTE — ED Notes (Signed)
Pt is leaving AMA MD notified.Marland KitchenMarland Kitchen

## 2022-09-04 NOTE — ED Notes (Signed)
Patient resting in bed with eyes closed, no s/s of distress, will continue to monitor.  

## 2022-09-04 NOTE — ED Notes (Signed)
Pt continuing to refuse / unable to have MRI. MRI sending pt back to ED. PA Wylder notified.

## 2022-09-04 NOTE — Progress Notes (Signed)
0932 sent pt back to the ED. unable to get exam. 3 meds onboard. called MARY RN and made her aware.  0550 pt still will not lay down. called RN & PA came over to access pt, PA explained her options. PA ordered morphine. given. pt would lay down, and agreeable to the test, pt claustro and would not allow me to put her in the magnet. She kept grabbing at the side of the magnet, saying stop, I can't do this, get me out. I'm scared. gave her time for meds to kick in. she was totally relaxed and asleep...until we slid the table into magnet. she refused. brought her out, she had to sit up again.   74 RN called back. pt given ativan, can we try again. Yes  0530 pt given PO meds for claustro prior to arrival. will not lay down, keeps sitting up on MRI table. called RN in ED. she said send pt back to ED. she will speak to provider.

## 2022-09-04 NOTE — ED Notes (Addendum)
MRI called, states patient was unable to complete the diagnostic test again. Roxan Hockey, PA notified.

## 2022-09-04 NOTE — ED Provider Notes (Signed)
Accepted handoff at shift change from prior team Please see prior provider note for more detail.   Briefly: Patient is 47 y.o.   DDX: concern for SEA  Plan: MRI    Physical Exam  BP (!) 141/76   Pulse (!) 51   Temp 98 F (36.7 C) (Oral)   Resp 11   Ht 5' (1.524 m)   Wt 59.4 kg   LMP 04/10/2018   SpO2 100%   BMI 25.58 kg/m   Physical Exam Neurological:     Comments: Moves lower extremities without difficulty, sensation normal.     Procedures  Procedures  ED Course / MDM    Medical Decision Making Amount and/or Complexity of Data Reviewed Labs: ordered. Radiology: ordered.  Risk OTC drugs. Prescription drug management.   5:33 AM patient brought back from MRI because she was unwilling to lay flat.  I have instructed the nurse to provide this patient with 2 mg of p.o. Ativan.  She is going back to MRI now.  6:06 AM Called to MRI -- Pt was hesitant to lay down. Is now laying down. Did get a small dose of morphine for pain since her discomfort is what is preventing her from laying flat.   Will sign out to day team for follow up on MRI.      Solon Augusta [place], [place] 09/04/22 0947    Glynn Octave, MD 09/04/22 [phone]

## 2024-05-10 ENCOUNTER — Encounter: Payer: Self-pay | Admitting: Family Medicine

## 2024-06-13 ENCOUNTER — Ambulatory Visit (HOSPITAL_COMMUNITY): Attending: Family Medicine

## 2024-06-13 ENCOUNTER — Other Ambulatory Visit: Payer: Self-pay

## 2024-06-13 ENCOUNTER — Encounter (HOSPITAL_COMMUNITY): Payer: Self-pay

## 2024-06-13 DIAGNOSIS — Z981 Arthrodesis status: Secondary | ICD-10-CM | POA: Diagnosis present

## 2024-06-13 DIAGNOSIS — M5459 Other low back pain: Secondary | ICD-10-CM | POA: Insufficient documentation

## 2024-06-13 DIAGNOSIS — M419 Scoliosis, unspecified: Secondary | ICD-10-CM | POA: Diagnosis not present

## 2024-06-13 DIAGNOSIS — Z7409 Other reduced mobility: Secondary | ICD-10-CM | POA: Diagnosis present

## 2024-06-13 NOTE — Therapy (Signed)
 " OUTPATIENT PHYSICAL THERAPY THORACOLUMBAR EVALUATION   Patient Name: HILLARY STRUSS MRN: [id number] DOB:04-08-1976, 49 y.o., female Today's Date: 06/13/2024  END OF SESSION:  PT End of Session - 06/13/24 1036     Visit Number 1    Date for Recertification  07/25/24    Authorization Type [place] MEDICAID [employer]    Authorization Time Period seeking auth    Progress Note Due on Visit 10    PT Start Time 0947    PT Stop Time 1032    PT Time Calculation (min) 45 min    Activity Tolerance Patient tolerated treatment well;Patient limited by pain    Behavior During Therapy WFL for tasks assessed/performed          Past Medical History:  Diagnosis Date   Anxiety    Arthritis    Bone spur    back area   DDD (degenerative disc disease), lumbar    DDD (degenerative disc disease), lumbar    Depressed    Lupus    Scoliosis    Uterine fibroid    Past Surgical History:  Procedure Laterality Date   ABDOMINAL HYSTERECTOMY     CESAREAN SECTION     HERNIA REPAIR     INSERTION OF MESH     LUMBAR FUSION     Patient Active Problem List   Diagnosis Date Noted   Acute blood loss anemia    Menorrhagia with regular cycle    Uterine bleeding    Gastroesophageal reflux disease    Depression    Vertigo 07/17/2017   Microcytic anemia 07/17/2017   Severe headache 07/17/2017   Anemia    Ataxia    Vaginal bleeding    Orthostatic hypotension     PCP: Darcie Lerner, NP   REFERRING PROVIDER: Provider, Placeholder  REFERRING DIAG: M41.9 (ICD-10-CM) - Scoliosis, unspecified  Rationale for Evaluation and Treatment: Rehabilitation  THERAPY DIAG:  Other low back pain  S/P lumbar fusion  Impaired functional mobility, balance, gait, and endurance  ONSET DATE: 10 years ago  SUBJECTIVE:                                                                                                                                                                                            SUBJECTIVE STATEMENT: Eval: Pt states she has had several surgeries on her back, found the scoliosis after falling at work and started having back trouble. Pt states she has been getting around okay since the surgery, a little better. Pt states before surgery she was forward and to the right side now she is just forward. Pt states she wishes  she could walk normal upright form. Pt states she also has difficulty sleeping, has to sleep on her sides. Pt has been using the cane for about 3 years for community ambulation. Pt states she thinks she could stand for about 20 minutes before having to take a break.  PERTINENT HISTORY:  Multiple surgeries, T9-Pelvis Decompression and PSIF, LS1 revision TLIF   PAIN:  Are you having pain? Yes: NPRS scale: 6/10, 10/10 at worse Pain location: low mid back some on the right, occasionally goes to right foot Pain description: straining to stand up, fighting against gravity Aggravating factors: getting busy around the house Relieving factors: take breaks, rest  PRECAUTIONS: None  RED FLAGS: None   WEIGHT BEARING RESTRICTIONS: No  FALLS:  Has patient fallen in last 6 months? No  LIVING ENVIRONMENT: Lives with: lives with their family Lives in: Mobile home Stairs: Yes: External: 3 steps; on right going up Has following equipment at home: Single point cane  OCCUPATION: disability  PLOF: Independent with basic ADLs and Requires assistive device for independence  PATIENT GOALS: stand up straighter and walk better and further, endurance  NEXT MD VISIT: 19th of next month  OBJECTIVE:  Note: Objective measures were completed at Evaluation unless otherwise noted.  DIAGNOSTIC FINDINGS:  CLINICAL DATA:  Patient fell 4 days ago. Low back pain since.   EXAM: LUMBAR SPINE - COMPLETE 4+ VIEW   COMPARISON:  06/24/2014   FINDINGS: Mild lumbar scoliosis convex towards the left. No anterior subluxation of lumbar vertebrae. No vertebral  compression deformities. No focal bone lesion or bone destruction. Bone cortex appears intact. Visualized sacrum appears intact. Vascular calcifications in the aorta.   IMPRESSION: Mild lumbar scoliosis convex towards the left. No acute displaced fractures identified.  PATIENT SURVEYS:  Modified Oswestry: 28 / 50 = 56.0 %  COGNITION: Overall cognitive status: Within functional limits for tasks assessed     SENSATION: Light touch: Impaired , some numbness noted in R foot on occasion   POSTURE: rounded shoulders, forward head, increased thoracic kyphosis, and flexed trunk   PALPATION: Increased tenderness to R side of T8-L5 paraspinals.  LUMBAR ROM:   AROM eval  Flexion 75  Extension 5, pain  Right lateral flexion 20, slight pain  Left lateral flexion 10, worst pain  Right rotation 50% limited, hip compensation noted  Left rotation 50% limited, hip compensation noted   (Blank rows = not tested)  LOWER EXTREMITY ROM:     Active  Right eval Left eval  Hip flexion    Hip extension    Hip abduction    Hip adduction    Hip internal rotation    Hip external rotation    Knee flexion    Knee extension    Ankle dorsiflexion    Ankle plantarflexion    Ankle inversion    Ankle eversion     (Blank rows = not tested)  LOWER EXTREMITY MMT:    MMT Right eval Left eval  Hip flexion 3, pain 3, pain  Hip extension 4 4  Hip abduction 4 3  Hip adduction    Hip internal rotation    Hip external rotation    Knee flexion    Knee extension    Ankle dorsiflexion    Ankle plantarflexion    Ankle inversion    Ankle eversion     (Blank rows = not tested)   FUNCTIONAL TESTS:  5 times sit to stand: TBA 2 minute walk test: 310 feet, SOB noted  GAIT: Distance walked: 400 feet Assistive device utilized: Single point cane Level of assistance: Modified independence Comments: pt demonstrates decreased hip extension bilaterally,   TREATMENT DATE:  06/13/2024    Evaluation: -ROM measured, Strength assessed, HEP prescribed, pt educated on prognosis, findings, and importance of HEP compliance if given.                                                                                                                                      PATIENT EDUCATION:  Education details: Pt was educated on findings of PT evaluation, prognosis, frequency of therapy visits and rationale, attendance policy, and HEP if given.   Person educated: Patient Education method: Explanation, Verbal cues, and Handouts Education comprehension: verbalized understanding and verbal cues required  HOME EXERCISE PROGRAM: Access Code: [email or web address] URL: [email or web address] Date: 06/13/2024 Prepared by: Lang Ada  Exercises - Supine Lower Trunk Rotation  - 1 x daily - 7 x weekly - 3 sets - 10 reps - Supine Posterior Pelvic Tilt  - 1 x daily - 7 x weekly - 3 sets - 10 reps - Prone Press Up On Elbows  - 1 x daily - 7 x weekly - 1 sets - 3 reps - 1-2 minutes hold  ASSESSMENT:  CLINICAL IMPRESSION: Patient is a 49 y.o. female who was seen today for physical therapy evaluation and treatment for M41.9 (ICD-10-CM) - Scoliosis, unspecified.   Patient demonstrates increased pain in low back, decreased core and LE strength, and impaired functional mobility. Patient also demonstrates difficulty with ambulation with antalgic pattern, decreased stride length and velocity noted. Patients symptoms likely arising from anatomical abnormalities and pts history of having multiple back surgeries with the most recent being a extensive fusion. Patient also demonstrates abnormal tenderness to palpation of lumbar spinal segments L1-L5 as well as to corresponding lumbar paraspinal musculature more on the right side than the left. Patient requires education on role of PT, prognosis, thoracolumbar spine anatomy, importance of HEP compliance and overall POC. Patient would benefit from skilled  physical therapy for decreased low back pain, increased endurance with ambulation, increased core and proximal LE strength/ROM, and improved balance for improved quality of life, return to higher level of function with ADLs, and progress towards therapy goals.    OBJECTIVE IMPAIRMENTS: Abnormal gait, decreased activity tolerance, decreased endurance, decreased knowledge of condition, decreased knowledge of use of DME, decreased mobility, difficulty walking, decreased ROM, decreased strength, hypomobility, impaired sensation, postural dysfunction, and pain.   ACTIVITY LIMITATIONS: carrying, lifting, bending, sitting, standing, squatting, sleeping, stairs, transfers, and bed mobility  PARTICIPATION LIMITATIONS: meal prep, cleaning, laundry, driving, shopping, community activity, and yard work  PERSONAL FACTORS: Age, Fitness, Past/current experiences, Time since onset of injury/illness/exacerbation, and 1 comorbidity: multiple back surgeries are also affecting patient's functional outcome.   REHAB POTENTIAL: Fair chronic in nature  CLINICAL DECISION MAKING: Stable/uncomplicated  EVALUATION COMPLEXITY: Low   GOALS: Goals reviewed with patient?  No  SHORT TERM GOALS: Target date: 07/04/24  Pt will be independent with HEP in order to demonstrate participation in Physical Therapy POC.  Baseline: Goal status: INITIAL  2.  Pt will report 4/10 pain with thoracolumbar mobility in order to demonstrate improved pain with ADLs.  Baseline: 6/10 Goal status: INITIAL  LONG TERM GOALS: Target date: 07/25/24  Pt will improve 5TSTS by at least 2.3 seconds in order to demonstrate improved functional strength to return to desired activities.  Baseline: see objective.  Goal status: INITIAL  2.  Pt will improve 2 MWT by at least 40 feet with LRAD in order to demonstrate improved functional ambulatory capacity in community setting.  Baseline: see objective.  Goal status: INITIAL  3.  Pt will improve  Modified Oswestry score by at least 6 points in order to demonstrate improved pain with functional goals and outcomes. Baseline: see objective.  Goal status: INITIAL  4.  Pt will report 4/10 pain with mobility in order to demonstrate reduced pain with ADLs lasting greater than 30 minutes.  Baseline: see objective.  Goal status: INITIAL   PLAN:  PT FREQUENCY: 1-2x/week  PT DURATION: 6 weeks  PLANNED INTERVENTIONS: 97110-Therapeutic exercises, 97530- Therapeutic activity, 97112- Neuromuscular re-education, 97535- Self Care, 02859- Manual therapy, [phone]- Gait training, [phone]- Electrical stimulation (unattended), [phone]- Electrical stimulation (manual), [phone] (1-2 muscles), [phone] (3+ muscles)- Dry Needling, Patient/Family education, Balance training, Stair training, Joint mobilization, Joint manipulation, Spinal manipulation, Spinal mobilization, DME instructions, Cryotherapy, and Moist heat.  PLAN FOR NEXT SESSION: 5TSTS, SLS, progress core and proximal LE strengthening and mobility, progress extension to pt tolerance   Lang Ada, PT, DPT [facility] Office: [phone] 10:43 AM, 06/13/24   Managed Medicaid Authorization Request Treatment Start Date: July 07, 2024  Visit Dx Codes: M54.59; Z98.1; Z74.09  Functional Tool Score: Modified Oswestry 28 / 50 = 56.0 %  For all possible CPT codes, reference the Planned Interventions line above.     Check all conditions that are expected to impact treatment: {Conditions expected to impact treatment:Structural or anatomic abnormalities and Unknown   If treatment provided at initial evaluation, no treatment charged due to lack of authorization.     "

## 2024-06-14 NOTE — Addendum Note (Signed)
 Addended by: Regan Mcbryar on: 06/14/2024 05:00 PM   Modules accepted: Orders

## 2024-06-20 ENCOUNTER — Ambulatory Visit (HOSPITAL_COMMUNITY)

## 2024-06-27 ENCOUNTER — Ambulatory Visit (HOSPITAL_COMMUNITY)

## 2024-06-29 ENCOUNTER — Ambulatory Visit (HOSPITAL_COMMUNITY): Admitting: Physical Therapy

## 2024-06-29 DIAGNOSIS — M5459 Other low back pain: Secondary | ICD-10-CM

## 2024-06-29 DIAGNOSIS — Z7409 Other reduced mobility: Secondary | ICD-10-CM

## 2024-06-29 DIAGNOSIS — Z981 Arthrodesis status: Secondary | ICD-10-CM

## 2024-06-29 NOTE — Therapy (Signed)
 " OUTPATIENT PHYSICAL THERAPY THORACOLUMBAR TREATMENT   Patient Name: Megan Gregory MRN: [id number] DOB:[birth year], 49 y.o., female Today's Date: 06/29/2024  END OF SESSION:  PT End of Session - 06/29/24 0832     Visit Number 2    Number of Visits 12    Date for Recertification  07/25/24    Authorization Type [place] MEDICAID [employer]    Authorization Time Period 12 visits approved 1/21-3/13    Authorization - Visit Number 1    Authorization - Number of Visits 12    Progress Note Due on Visit 10    PT Start Time 0820    PT Stop Time 0908    PT Time Calculation (min) 48 min    Activity Tolerance Patient tolerated treatment well;Patient limited by pain    Behavior During Therapy WFL for tasks assessed/performed           Past Medical History:  Diagnosis Date   Anxiety    Arthritis    Bone spur    back area   DDD (degenerative disc disease), lumbar    DDD (degenerative disc disease), lumbar    Depressed    Lupus    Scoliosis    Uterine fibroid    Past Surgical History:  Procedure Laterality Date   ABDOMINAL HYSTERECTOMY     CESAREAN SECTION     HERNIA REPAIR     INSERTION OF MESH     LUMBAR FUSION     Patient Active Problem List   Diagnosis Date Noted   Acute blood loss anemia    Menorrhagia with regular cycle    Uterine bleeding    Gastroesophageal reflux disease    Depression    Vertigo 07/17/2017   Microcytic anemia 07/17/2017   Severe headache 07/17/2017   Anemia    Ataxia    Vaginal bleeding    Orthostatic hypotension     PCP: Darcie Lerner, NP   REFERRING PROVIDER: Benita Sarna, PA  REFERRING DIAG: M41.9 (ICD-10-CM) - Scoliosis, unspecified  Rationale for Evaluation and Treatment: Rehabilitation  THERAPY DIAG:  Other low back pain  S/P lumbar fusion  Impaired functional mobility, balance, gait, and endurance  ONSET DATE: 10 years ago  SUBJECTIVE:                                                                                                                                                                                            SUBJECTIVE STATEMENT: Pt states she just wants to be more upright.  Having difficulty keeping her Rt knee straight.  Currently 7/10 pain.    Eval: Pt states she has had  several surgeries on her back, found the scoliosis after falling at work and started having back trouble. Pt states she has been getting around okay since the surgery, a little better. Pt states before surgery she was forward and to the right side now she is just forward. Pt states she wishes she could walk normal upright form. Pt states she also has difficulty sleeping, has to sleep on her sides. Pt has been using the cane for about 3 years for community ambulation. Pt states she thinks she could stand for about 20 minutes before having to take a break.  PERTINENT HISTORY:  Multiple surgeries, T9-Pelvis Decompression and PSIF, LS1 revision TLIF   PAIN:  Are you having pain? Yes: NPRS scale: 6/10, 10/10 at worse Pain location: low mid back some on the right, occasionally goes to right foot Pain description: straining to stand up, fighting against gravity Aggravating factors: getting busy around the house Relieving factors: take breaks, rest  PRECAUTIONS: None  RED FLAGS: None   WEIGHT BEARING RESTRICTIONS: No  FALLS:  Has patient fallen in last 6 months? No  LIVING ENVIRONMENT: Lives with: lives with their family Lives in: Mobile home Stairs: Yes: External: 3 steps; on right going up Has following equipment at home: Single point cane  OCCUPATION: disability  PLOF: Independent with basic ADLs and Requires assistive device for independence  PATIENT GOALS: stand up straighter and walk better and further, endurance  NEXT MD VISIT: 19th of next month  OBJECTIVE:  Note: Objective measures were completed at Evaluation unless otherwise noted.  DIAGNOSTIC FINDINGS:  CLINICAL DATA:  Patient fell 4 days  ago. Low back pain since.   EXAM: LUMBAR SPINE - COMPLETE 4+ VIEW   COMPARISON:  06/24/2014   FINDINGS: Mild lumbar scoliosis convex towards the left. No anterior subluxation of lumbar vertebrae. No vertebral compression deformities. No focal bone lesion or bone destruction. Bone cortex appears intact. Visualized sacrum appears intact. Vascular calcifications in the aorta.   IMPRESSION: Mild lumbar scoliosis convex towards the left. No acute displaced fractures identified.  PATIENT SURVEYS:  Modified Oswestry: 28 / 50 = 56.0 %  COGNITION: Overall cognitive status: Within functional limits for tasks assessed     SENSATION: Light touch: Impaired , some numbness noted in R foot on occasion   POSTURE: rounded shoulders, forward head, increased thoracic kyphosis, and flexed trunk   PALPATION: Increased tenderness to R side of T8-L5 paraspinals.  LUMBAR ROM:   AROM eval  Flexion 75  Extension 5, pain  Right lateral flexion 20, slight pain  Left lateral flexion 10, worst pain  Right rotation 50% limited, hip compensation noted  Left rotation 50% limited, hip compensation noted   (Blank rows = not tested)   LOWER EXTREMITY MMT:    MMT Right eval Left eval  Hip flexion 3, pain 3, pain  Hip extension 4 4  Hip abduction 4 3  Hip adduction    Hip internal rotation    Hip external rotation    Knee flexion    Knee extension    Ankle dorsiflexion    Ankle plantarflexion    Ankle inversion    Ankle eversion     (Blank rows = not tested)   FUNCTIONAL TESTS:  5 times sit to stand: TBA 2 minute walk test: 310 feet, SOB noted  GAIT: Distance walked: 400 feet Assistive device utilized: Single point cane Level of assistance: Modified independence Comments: pt demonstrates decreased hip extension bilaterally,   TREATMENT DATE:  06/13/2024  Ambulation using SPC on Lt side vs Rt, walking pole also used 2 laps around gym total Sit to Stand   2 sets - 5  reps Standing:  Hip Flexor Stretch with foot on 8 step  3 reps - 30 sec hold each side with upright posturing Back Flat Against Wall  5 reps - 30 sec hold Doorway Pec Stretch at 90 Degrees Abduction  3 reps - 30 second hold Standing Lumbar Extension with Counter  10 reps   Evaluation: -ROM measured, Strength assessed, HEP prescribed, pt educated on prognosis, findings, and importance of HEP compliance if given.                                                                                                                                 PATIENT EDUCATION:  Education details: Pt was educated on findings of PT evaluation, prognosis, frequency of therapy visits and rationale, attendance policy, and HEP if given.   Person educated: Patient Education method: Explanation, Verbal cues, and Handouts Education comprehension: verbalized understanding and verbal cues required  HOME EXERCISE PROGRAM: Access Code: [email or web address] URL: [email or web address] Date: 06/13/2024 Prepared by: Lang Ada Exercises - Supine Lower Trunk Rotation  - 1 x daily - 7 x weekly - 3 sets - 10 reps - Supine Posterior Pelvic Tilt  - 1 x daily - 7 x weekly - 3 sets - 10 reps - Prone Press Up On Elbows  - 1 x daily - 7 x weekly - 1 sets - 3 reps - 1-2 minutes hold  Access Code: [email or web address] URL: [email or web address] Date: 06/29/2024 Prepared by: Greig Fuse Exercises - Sit to Stand  - 2 x daily - 7 x weekly - 2 sets - 5 reps - Hip Flexor Stretch with Chair  - 2 x daily - 7 x weekly - 3 reps - 30 sec hold - Standing with Back Flat Against Wall  - 2 x daily - 7 x weekly - 10 reps - 30 sec hold - Doorway Pec Stretch at 90 Degrees Abduction  - 2 x daily - 7 x weekly - 3 reps - 30 second hold - Standing Lumbar Extension with Counter  - 2 x daily - 7 x weekly - 10 reps  ASSESSMENT:  CLINICAL IMPRESSION: Reviewed goals and POC moving forward.  Educated on stretching anterior/strengthening  posterior with pt verbalizing understanding.  Added stretches to promote more upright posturing. Pt with positive results with these in pain free ROM.  Noted improvement in posturing at end of session today.  Gait trained with cane on Lt side vs Rt with less lateral lean to Rt.  Pt used mirror for biofeedback and reported noted change and improvement in gait.  Pt did prefer the walking/trekking pole over [facility].  Informed of purchasing poles on [employer].  Updated HEP to include added stretches from today's session.  Did not have time to complete test measures not completed at evaluation so will need  to do this next session.  Pt will continue to benefit from skilled physical therapy for decreased low back pain, increased endurance with ambulation, increased core and proximal LE strength/ROM, and improved balance for improved quality of life, return to higher level of function with ADLs, and progress towards therapy goals.    OBJECTIVE IMPAIRMENTS: Abnormal gait, decreased activity tolerance, decreased endurance, decreased knowledge of condition, decreased knowledge of use of DME, decreased mobility, difficulty walking, decreased ROM, decreased strength, hypomobility, impaired sensation, postural dysfunction, and pain.   ACTIVITY LIMITATIONS: carrying, lifting, bending, sitting, standing, squatting, sleeping, stairs, transfers, and bed mobility  PARTICIPATION LIMITATIONS: meal prep, cleaning, laundry, driving, shopping, community activity, and yard work  PERSONAL FACTORS: Age, Fitness, Past/current experiences, Time since onset of injury/illness/exacerbation, and 1 comorbidity: multiple back surgeries are also affecting patient's functional outcome.   REHAB POTENTIAL: Fair chronic in nature  CLINICAL DECISION MAKING: Stable/uncomplicated  EVALUATION COMPLEXITY: Low   GOALS: Goals reviewed with patient? No  SHORT TERM GOALS: Target date: 07/04/24  Pt will be independent with HEP in order to  demonstrate participation in Physical Therapy POC.  Baseline: Goal status: INITIAL  2.  Pt will report 4/10 pain with thoracolumbar mobility in order to demonstrate improved pain with ADLs.  Baseline: 6/10 Goal status: INITIAL  LONG TERM GOALS: Target date: 07/25/24  Pt will improve 5TSTS by at least 2.3 seconds in order to demonstrate improved functional strength to return to desired activities.  Baseline: see objective.  Goal status: INITIAL  2.  Pt will improve 2 MWT by at least 40 feet with LRAD in order to demonstrate improved functional ambulatory capacity in community setting.  Baseline: see objective.  Goal status: INITIAL  3.  Pt will improve Modified Oswestry score by at least 6 points in order to demonstrate improved pain with functional goals and outcomes. Baseline: see objective.  Goal status: INITIAL  4.  Pt will report 4/10 pain with mobility in order to demonstrate reduced pain with ADLs lasting greater than 30 minutes.  Baseline: see objective.  Goal status: INITIAL   PLAN:  PT FREQUENCY: 1-2x/week  PT DURATION: 6 weeks  PLANNED INTERVENTIONS: 97110-Therapeutic exercises, 97530- Therapeutic activity, 97112- Neuromuscular re-education, 97535- Self Care, 02859- Manual therapy, [phone]- Gait training, [phone]- Electrical stimulation (unattended), [phone]- Electrical stimulation (manual), [phone] (1-2 muscles), [phone] (3+ muscles)- Dry Needling, Patient/Family education, Balance training, Stair training, Joint mobilization, Joint manipulation, Spinal manipulation, Spinal mobilization, DME instructions, Cryotherapy, and Moist heat.  PLAN FOR NEXT SESSION: continue to progress core and proximal LE strengthening and mobility, progress extension to pt tolerance.  Next session begin seated postural exercises, thoracic excursions with UE movements.  Progress to postural tband strengthening.  Complete 5X STS test and SLS ability next session.     Greig KATHEE Fuse, PTA/CLT [facility]  Outpatient Rehabilitation [facility] Ph: [phone] 9:28 AM, 06/29/24   "

## 2024-07-04 ENCOUNTER — Ambulatory Visit (HOSPITAL_COMMUNITY): Admitting: Physical Therapy

## 2024-07-11 ENCOUNTER — Ambulatory Visit (HOSPITAL_COMMUNITY)

## 2024-07-12 ENCOUNTER — Ambulatory Visit: Admitting: Cardiology

## 2024-07-18 ENCOUNTER — Ambulatory Visit (HOSPITAL_COMMUNITY)
